# Patient Record
Sex: Female | Born: 1970 | Race: White | Hispanic: No | Marital: Married | Smoking: Current every day smoker
Health system: Southern US, Community
[De-identification: ages and names within clinical notes are randomized; demographics above are authoritative.]

## PROBLEM LIST (undated history)

## (undated) DIAGNOSIS — F419 Anxiety disorder, unspecified: Secondary | ICD-10-CM

## (undated) DIAGNOSIS — T4145XA Adverse effect of unspecified anesthetic, initial encounter: Secondary | ICD-10-CM

## (undated) DIAGNOSIS — I10 Essential (primary) hypertension: Secondary | ICD-10-CM

## (undated) DIAGNOSIS — M1990 Unspecified osteoarthritis, unspecified site: Secondary | ICD-10-CM

## (undated) DIAGNOSIS — T8859XA Other complications of anesthesia, initial encounter: Secondary | ICD-10-CM

## (undated) DIAGNOSIS — I8390 Asymptomatic varicose veins of unspecified lower extremity: Secondary | ICD-10-CM

## (undated) HISTORY — PX: BACK SURGERY: SHX140

## (undated) HISTORY — PX: OTHER SURGICAL HISTORY: SHX169

## (undated) HISTORY — PX: JOINT REPLACEMENT: SHX530

## (undated) HISTORY — PX: ABDOMINAL HYSTERECTOMY: SHX81

---

## 2005-01-12 ENCOUNTER — Emergency Department: Payer: Self-pay | Admitting: Emergency Medicine

## 2008-01-02 ENCOUNTER — Encounter: Admission: RE | Admit: 2008-01-02 | Discharge: 2008-01-02 | Payer: Self-pay | Admitting: Orthopedic Surgery

## 2008-08-27 ENCOUNTER — Ambulatory Visit: Payer: Self-pay | Admitting: *Deleted

## 2008-08-27 ENCOUNTER — Inpatient Hospital Stay (HOSPITAL_COMMUNITY): Admission: RE | Admit: 2008-08-27 | Discharge: 2008-08-28 | Payer: Self-pay | Admitting: Orthopedic Surgery

## 2008-08-28 ENCOUNTER — Encounter (INDEPENDENT_AMBULATORY_CARE_PROVIDER_SITE_OTHER): Payer: Self-pay | Admitting: Orthopedic Surgery

## 2008-12-01 ENCOUNTER — Encounter: Payer: Self-pay | Admitting: Orthopedic Surgery

## 2008-12-12 ENCOUNTER — Encounter: Payer: Self-pay | Admitting: Orthopedic Surgery

## 2009-01-12 ENCOUNTER — Encounter: Payer: Self-pay | Admitting: Orthopedic Surgery

## 2010-01-21 ENCOUNTER — Emergency Department: Payer: Self-pay | Admitting: Emergency Medicine

## 2011-04-26 NOTE — Op Note (Signed)
NAMEKARLISA, GAUBERT NO.:  [id number]   MEDICAL RECORD NO.:  [id number]          PATIENT TYPE:  INP   LOCATION:  5017                         FACILITY:  [facility]   PHYSICIAN:  Balinda Quails, M.D.    DATE OF BIRTH:  1971-08-23   DATE OF PROCEDURE:  08/27/2008  DATE OF DISCHARGE:                               OPERATIVE REPORT   SURGEON:  Balinda Quails, MD.   CO-SURGEONAlvy Beal, MD.   ANESTHETIC:  General endotracheal.   PREOPERATIVE DIAGNOSIS:  L5-S1 degenerative disk disease.   POSTOPERATIVE DIAGNOSIS:  L5-S1 degenerative disk disease.   PROCEDURE:  L5-S1 anterior lumbar interbody fusion (ALIF).   CLINICAL NOTE:  Bonnie Overdorf is a 40 year old female with history of  chronic back pain scheduled at this time to undergo L5-S1 ALIF.  She was  seen preoperatively in holding area.  No contraindications to surgery.  Each of the operative procedure were reviewed with the patient including  potential complications.  She consented for surgery.   OPERATIVE PROCEDURE:  The patient was brought to the operating room in  stable hemodynamic condition.  Placed under general endotracheal  anesthesia.  Pulse oximetry placed on the left foot.  Foley catheter,  arterial line, and central venous catheter were in place.  The abdomen  was prepped and draped in a sterile fashion.   Transverse skin incision was made in the left lower quadrant at the  projection of L5-S1.  Subcutaneous tissue was divided with  electrocautery.  The left anterior rectus sheath was incised from  midline to lateral margin of rectus muscle.  Rectus muscle was mobilized  medially and the left retroperitoneal space was entered laterally.  The  peritoneal contents were rotated anteriorly.  The left ureter was  mobilized and retracted with the peritoneal contents.  The left common  external iliac artery identified and skeletonized bluntly.  The left  common iliac vein was pushed laterally and the  L5-S1 disk could be  palpated.  Blunt dissection was carried directly down under the disk and  small bridging veins were controlled with bipolar cautery and divided.  The middle sacral vessels were ligated with clips and divided.  The L5-  S1 disk was then exposed with blunt dissection from left-to-right.   At completion of this, the Thompson-Brau retractor system was used and  reverse-lip blades were placed on the lateral margins of L5-S1  bilaterally.  Malleable retractors were placed inferiorly and  superiorly.  The fusion procedure was then completed by Dr. Shon Baton.  Once completed, retractors were removed and closure dictated separately  by Dr. Shon Baton.  There were no apparent complications.      Balinda Quails, M.D.  Electronically Signed     PGH/MEDQ  D:  08/27/2008  T:  08/28/2008  Job:  [id number]   cc:   Alvy Beal, MD

## 2011-04-26 NOTE — Op Note (Signed)
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   POSTOPERATIVE DIAGNOSES:  Degenerative lumbar spondylolisthesis and  degenerative disk disease L5-S1.   OPERATIVE PROCEDURE:  Anterior lumbar interbody fusion L5-S1 with a  [employer] PEEK small 14 mm high interbody graft packed with active fuse  with the RSI anterior lumbar zero-profile plating system with 20 mm  locking screws.   COMPLICATIONS:  None.   CONDITION:  Stable.   HISTORY:  This is a very pleasant 40 year old woman with longstanding  debilitating low back pain.  She has tried and failed multiple forms of  conservative management including narcotic medications, physiotherapy,  chiropractic manipulation, and injection therapy.  Because of the  persistence of her pain and the poor quality of life, she elected to  proceed with surgery.  All appropriate risks, benefits, and alternatives  of the surgery were discussed with the patient and consent was obtained.   OPERATIVE NOTE:  The patient was brought to the operating room, placed  supine on the operating table.  After successful induction of general  anesthesia endotracheal intubation, TEDs, SCDs, and Foley were applied.  The anterior lumbar spine was then prepped and draped in a standard  fashion.  Dr. Liliane Bade then came and did a standard anterior lumbar  approach to transverse Pfannenstiel incision.  I was present to assist  him for the approach.  Once the anterior lumbar spine was visualized,  then the iliac vessels were retracted and veins were retracted.  The  Thompson  retractors were placed at the appropriate depth.  We then  marked the L5-S1 space, took an x-ray and confirmed the appropriate  level.   At this point, I performed an annulotomy with a #10 blade scalpel and  then resected the disk with a combination of curettes, Kerrison  rongeurs.  I then distracted the interbody space and continued working  posteriorly down to the posterior annulus.  I then released the  posterior annulus and then was able to pass a 4-mm Kerrison behind the  bodies of L5-S1 resecting the posterior osteophytes and removing and  performing a more generous foraminotomy.   At this point, I had clear parallel distraction of the endplates and I  completely released the annulus and had adequate decompression.   At this point, satisfied with my diskectomy, I then made sure I had  removed all of this off the cartilaginous endplate and I had excellent  bleeding endplate bone for fusion.  I rasped and then I measured with a  14-mm and 16-mm interbody device.  I felt as though the 14 provided less  posterior distraction and it gave me adequate interbody fixation.  I  then took the [employer]  interbody PEEK cage packed with active fuse and  used the self-distracting inserter to place it to the appropriate depth.  Once I was properly positioned, I was then able to pack the remaining  portion of active fuse anterior to the cage for sentinel fusion.  With  all the bone graft properly positioned, I then swept behind the  interbody device with a nerve hook to ensure that it was still on, it  was not irritating, it was not too far posterior and there was no bone  fragments that were pushed back.  I then took the appropriate sized  trial for the fixation device and then took the actual zero-profile  plating system, secured it and malleted at the appropriate depth.  I  then secured it to the body of L5 with two 20-mm lag screws and a single  lag screw into the body of S1.  I then had  excellent purchase with all  the screws and I put the final locking cap to prevent backup.  This was  torqued appropriately.  I then irrigated copiously with normal saline,  removed the retracting blades and checked for any brisk bleeding.  Once  hemostasis was obtained, I then took final AP and lateral views with  fluoro demonstrating satisfactory depth of the graft and position of the  hardware.  I then closed the rectus sheath with a running #1 Vicryl  suture and did a layered closure with a 0 Vicryl and a 2-0 interrupted  Vicryl.  I then used a 3-0 Monocryl to close the skin.  Steri-Strips and  a dry dressing were applied.  She was extubated and transferred to the  PACU without incident.  At the end of the case, all needle and sponge  counts were correct and the final abdominal x-rays were read by the  Radiology and there were noted to be no retained surgical devices other  than the appropriate hardware.      Alvy Beal, MD  Electronically Signed     DDB/MEDQ  D:  08/27/2008  T:  08/28/2008  Job:  [id number]   cc:   Balinda Quails, M.D.

## 2011-04-28 ENCOUNTER — Ambulatory Visit: Payer: Self-pay | Admitting: Family Medicine

## 2011-09-12 LAB — BASIC METABOLIC PANEL
BUN: 3 — ABNORMAL LOW
CO2: 30
Chloride: 103
Potassium: 3.5

## 2011-09-12 LAB — CBC
HCT: 37.6
MCV: 101.3 — ABNORMAL HIGH
Platelets: 272
RBC: 3.72 — ABNORMAL LOW
WBC: 8.5

## 2011-09-14 LAB — BASIC METABOLIC PANEL
BUN: 7
CO2: 27
Chloride: 101
Creatinine, Ser: 0.58

## 2011-09-14 LAB — TYPE AND SCREEN: ABO/RH(D): O POS

## 2011-09-14 LAB — CBC
MCHC: 33.8
MCV: 100.4 — ABNORMAL HIGH
Platelets: 285

## 2011-11-25 ENCOUNTER — Ambulatory Visit: Payer: Self-pay | Admitting: Family Medicine

## 2012-07-13 ENCOUNTER — Other Ambulatory Visit (HOSPITAL_COMMUNITY): Payer: Self-pay | Admitting: Orthopedic Surgery

## 2012-07-13 DIAGNOSIS — M25561 Pain in right knee: Secondary | ICD-10-CM

## 2012-07-23 ENCOUNTER — Other Ambulatory Visit (HOSPITAL_COMMUNITY): Payer: Self-pay

## 2012-07-23 ENCOUNTER — Encounter (HOSPITAL_COMMUNITY): Payer: Self-pay

## 2012-07-24 ENCOUNTER — Ambulatory Visit (HOSPITAL_COMMUNITY)
Admission: RE | Admit: 2012-07-24 | Discharge: 2012-07-24 | Disposition: A | Discharge status: Home / Self Care | Payer: Medicare Other | Source: Ambulatory Visit | Attending: Orthopedic Surgery | Admitting: Orthopedic Surgery

## 2012-07-24 ENCOUNTER — Encounter (HOSPITAL_COMMUNITY)
Admission: RE | Admit: 2012-07-24 | Discharge: 2012-07-24 | Disposition: A | Discharge status: Home / Self Care | Payer: Medicare Other | Source: Ambulatory Visit | Attending: Orthopedic Surgery | Admitting: Orthopedic Surgery

## 2012-07-24 DIAGNOSIS — M25561 Pain in right knee: Secondary | ICD-10-CM

## 2012-07-24 DIAGNOSIS — Z96659 Presence of unspecified artificial knee joint: Secondary | ICD-10-CM | POA: Insufficient documentation

## 2012-07-24 DIAGNOSIS — M25569 Pain in unspecified knee: Secondary | ICD-10-CM | POA: Insufficient documentation

## 2012-07-24 MED ORDER — TECHNETIUM TC 99M MEDRONATE IV KIT
25.0000 | PACK | Freq: Once | INTRAVENOUS | Status: AC | PRN
  Administered 2012-07-24: 25 via INTRAVENOUS

## 2014-01-03 ENCOUNTER — Other Ambulatory Visit (HOSPITAL_COMMUNITY): Payer: Self-pay | Admitting: Orthopedic Surgery

## 2014-01-03 DIAGNOSIS — M25561 Pain in right knee: Secondary | ICD-10-CM

## 2014-01-07 ENCOUNTER — Encounter (HOSPITAL_COMMUNITY)
Admission: RE | Admit: 2014-01-07 | Discharge: 2014-01-07 | Disposition: A | Discharge status: Home / Self Care | Payer: Medicare Other | Source: Ambulatory Visit | Attending: Orthopedic Surgery | Admitting: Orthopedic Surgery

## 2014-01-07 ENCOUNTER — Ambulatory Visit (HOSPITAL_COMMUNITY)
Admission: RE | Admit: 2014-01-07 | Discharge: 2014-01-07 | Disposition: A | Discharge status: Home / Self Care | Payer: Medicare Other | Source: Ambulatory Visit | Attending: Orthopedic Surgery | Admitting: Orthopedic Surgery

## 2014-01-07 DIAGNOSIS — M25569 Pain in unspecified knee: Secondary | ICD-10-CM | POA: Insufficient documentation

## 2014-01-07 DIAGNOSIS — M25561 Pain in right knee: Secondary | ICD-10-CM

## 2014-01-07 DIAGNOSIS — Z96659 Presence of unspecified artificial knee joint: Secondary | ICD-10-CM | POA: Insufficient documentation

## 2014-01-07 MED ORDER — TECHNETIUM TC 99M MEDRONATE IV KIT
25.0000 | PACK | Freq: Once | INTRAVENOUS | Status: AC | PRN
  Administered 2014-01-07: 25 via INTRAVENOUS

## 2014-06-04 ENCOUNTER — Encounter (HOSPITAL_COMMUNITY): Payer: Self-pay | Admitting: Pharmacy Technician

## 2014-06-04 NOTE — Patient Instructions (Signed)
Wendy Henderson  06/04/2014   Your procedure is scheduled on:  06/09/14  0730-1005  [facility]  Follow the Signs to Short Stay Center at      0515    am  Call this number if you have problems the morning of surgery: [phone]   Remember:   Do not eat food or drink liquids after midnight.   Take these medicines the morning of surgery with A SIP OF WATER:    Do not wear jewelry, make-up or nail polish.  Do not wear lotions, powders, or perfumes, deodorant.    Do not shave 48 hours prior to surgery.   Do not bring valuables to the hospital.  Contacts, dentures or bridgework may not be worn into surgery.  Leave suitcase in the car. After surgery it may be brought to your room.  For patients admitted to the hospital, checkout time is 11:00 AM the day of  discharge.   [facility] - Preparing for Surgery Before surgery, you can play an important role.  Because skin is not sterile, your skin needs to be as free of germs as possible.  You can reduce the number of germs on your skin by washing with CHG (chlorahexidine gluconate) soap before surgery.  CHG is an antiseptic cleaner which kills germs and bonds with the skin to continue killing germs even after washing. Please DO NOT use if you have an allergy to CHG or antibacterial soaps.  If your skin becomes reddened/irritated stop using the CHG and inform your nurse when you arrive at Short Stay. Do not shave (including legs and underarms) for at least 48 hours prior to the first CHG shower.  You may shave your face/neck. Please follow these instructions carefully:  1.  Shower with CHG Soap the night before surgery and the  morning of Surgery.  2.  If you choose to wash your hair, wash your hair first as usual with your  normal  shampoo.  3.  After you shampoo, rinse your hair and body thoroughly to remove the  shampoo.                           4.  Use CHG as you would any other liquid soap.  You can apply chg directly  to  the skin and wash                       Gently with a scrungie or clean washcloth.  5.  Apply the CHG Soap to your body ONLY FROM THE NECK DOWN.   Do not use on face/ open                           Wound or open sores. Avoid contact with eyes, ears mouth and genitals (private parts).                       Wash face,  Genitals (private parts) with your normal soap.             6.  Wash thoroughly, paying special attention to the area where your surgery  will be performed.  7.  Thoroughly rinse your body with warm water from the neck down.  8.  DO NOT shower/wash with your normal soap after using and rinsing off  the CHG Soap.  9.  Pat yourself dry with a clean towel.            10.  Wear clean pajamas.            11.  Place clean sheets on your bed the night of your first shower and do not  sleep with pets. Day of Surgery : Do not apply any lotions/deodorants the morning of surgery.  Please wear clean clothes to the hospital/surgery center.  FAILURE TO FOLLOW THESE INSTRUCTIONS MAY RESULT IN THE CANCELLATION OF YOUR SURGERY PATIENT SIGNATURE_________________________________  NURSE SIGNATURE__________________________________  ________________________________________________________________________  WHAT IS A BLOOD TRANSFUSION? Blood Transfusion Information  A transfusion is the replacement of blood or some of its parts. Blood is made up of multiple cells which provide different functions.  Red blood cells carry oxygen and are used for blood loss replacement.  White blood cells fight against infection.  Platelets control bleeding.  Plasma helps clot blood.  Other blood products are available for specialized needs, such as hemophilia or other clotting disorders. BEFORE THE TRANSFUSION  Who gives blood for transfusions?   Healthy volunteers who are fully evaluated to make sure their blood is safe. This is blood bank blood. Transfusion therapy is the safest it has ever  been in the practice of medicine. Before blood is taken from a donor, a complete history is taken to make sure that person has no history of diseases nor engages in risky social behavior (examples are intravenous drug use or sexual activity with multiple partners). The donor's travel history is screened to minimize risk of transmitting infections, such as malaria. The donated blood is tested for signs of infectious diseases, such as HIV and hepatitis. The blood is then tested to be sure it is compatible with you in order to minimize the chance of a transfusion reaction. If you or a relative donates blood, this is often done in anticipation of surgery and is not appropriate for emergency situations. It takes many days to process the donated blood. RISKS AND COMPLICATIONS Although transfusion therapy is very safe and saves many lives, the main dangers of transfusion include:   Getting an infectious disease.  Developing a transfusion reaction. This is an allergic reaction to something in the blood you were given. Every precaution is taken to prevent this. The decision to have a blood transfusion has been considered carefully by your caregiver before blood is given. Blood is not given unless the benefits outweigh the risks. AFTER THE TRANSFUSION  Right after receiving a blood transfusion, you will usually feel much better and more energetic. This is especially true if your red blood cells have gotten low (anemic). The transfusion raises the level of the red blood cells which carry oxygen, and this usually causes an energy increase.  The nurse administering the transfusion will monitor you carefully for complications. HOME CARE INSTRUCTIONS  No special instructions are needed after a transfusion. You may find your energy is better. Speak with your caregiver about any limitations on activity for underlying diseases you may have. SEEK MEDICAL CARE IF:   Your condition is not improving after your  transfusion.  You develop redness or irritation at the intravenous (IV) site. SEEK IMMEDIATE MEDICAL CARE IF:  Any of the following symptoms occur over the next 12 hours:  Shaking chills.  You have a temperature by mouth above 102 F (38.9 C), not controlled by medicine.  Chest, back, or muscle pain.  People around you feel you are not acting correctly or  are confused.  Shortness of breath or difficulty breathing.  Dizziness and fainting.  You get a rash or develop hives.  You have a decrease in urine output.  Your urine turns a dark color or changes to pink, red, or brown. Any of the following symptoms occur over the next 10 days:  You have a temperature by mouth above 102 F (38.9 C), not controlled by medicine.  Shortness of breath.  Weakness after normal activity.  The white part of the eye turns yellow (jaundice).  You have a decrease in the amount of urine or are urinating less often.  Your urine turns a dark color or changes to pink, red, or brown. Document Released: 11/25/2000 Document Revised: 02/20/2012 Document Reviewed: 07/14/2008 ExitCare Patient Information 2014 [place].  _______________________________________________________________________  Incentive Spirometer  An incentive spirometer is a tool that can help keep your lungs clear and active. This tool measures how well you are filling your lungs with each breath. Taking long deep breaths may help reverse or decrease the chance of developing breathing (pulmonary) problems (especially infection) following:  A long period of time when you are unable to move or be active. BEFORE THE PROCEDURE   If the spirometer includes an indicator to show your best effort, your nurse or respiratory therapist will set it to a desired goal.  If possible, sit up straight or lean slightly forward. Try not to slouch.  Hold the incentive spirometer in an upright position. INSTRUCTIONS FOR USE  1. Sit on the  edge of your bed if possible, or sit up as far as you can in bed or on a chair. 2. Hold the incentive spirometer in an upright position. 3. Breathe out normally. 4. Place the mouthpiece in your mouth and seal your lips tightly around it. 5. Breathe in slowly and as deeply as possible, raising the piston or the ball toward the top of the column. 6. Hold your breath for 3-5 seconds or for as long as possible. Allow the piston or ball to fall to the bottom of the column. 7. Remove the mouthpiece from your mouth and breathe out normally. 8. Rest for a few seconds and repeat Steps 1 through 7 at least 10 times every 1-2 hours when you are awake. Take your time and take a few normal breaths between deep breaths. 9. The spirometer may include an indicator to show your best effort. Use the indicator as a goal to work toward during each repetition. 10. After each set of 10 deep breaths, practice coughing to be sure your lungs are clear. If you have an incision (the cut made at the time of surgery), support your incision when coughing by placing a pillow or rolled up towels firmly against it. Once you are able to get out of bed, walk around indoors and cough well. You may stop using the incentive spirometer when instructed by your caregiver.  RISKS AND COMPLICATIONS  Take your time so you do not get dizzy or light-headed.  If you are in pain, you may need to take or ask for pain medication before doing incentive spirometry. It is harder to take a deep breath if you are having pain. AFTER USE  Rest and breathe slowly and easily.  It can be helpful to keep track of a log of your progress. Your caregiver can provide you with a simple table to help with this. If you are using the spirometer at home, follow these instructions: [place] IF:  You are having difficultly using the spirometer.  You have trouble using the spirometer as often as instructed.  Your pain medication is not giving enough  relief while using the spirometer.  You develop fever of 100.5 F (38.1 C) or higher. SEEK IMMEDIATE MEDICAL CARE IF:   You cough up bloody sputum that had not been present before.  You develop fever of 102 F (38.9 C) or greater.  You develop worsening pain at or near the incision site. MAKE SURE YOU:   Understand these instructions.  Will watch your condition.  Will get help right away if you are not doing well or get worse. Document Released: 04/10/2007 Document Revised: 02/20/2012 Document Reviewed: 06/11/2007 ExitCare Patient Information 2014 ExitCare, [place].   ________________________________________________________________________    Please read over the following fact sheets that you were given: MRSA Information, coughing and deep breathing exercises, leg exercises

## 2014-06-05 ENCOUNTER — Ambulatory Visit (HOSPITAL_COMMUNITY)
Admission: RE | Admit: 2014-06-05 | Discharge: 2014-06-05 | Disposition: A | Discharge status: Home / Self Care | Payer: Medicare Other | Source: Ambulatory Visit | Attending: Orthopedic Surgery | Admitting: Orthopedic Surgery

## 2014-06-05 ENCOUNTER — Encounter (HOSPITAL_COMMUNITY): Payer: Self-pay

## 2014-06-05 ENCOUNTER — Encounter (HOSPITAL_COMMUNITY)
Admission: RE | Admit: 2014-06-05 | Discharge: 2014-06-05 | Disposition: A | Discharge status: Home / Self Care | Payer: Medicare Other | Source: Ambulatory Visit | Attending: Orthopedic Surgery | Admitting: Orthopedic Surgery

## 2014-06-05 DIAGNOSIS — Z01812 Encounter for preprocedural laboratory examination: Secondary | ICD-10-CM | POA: Insufficient documentation

## 2014-06-05 DIAGNOSIS — Z01818 Encounter for other preprocedural examination: Secondary | ICD-10-CM | POA: Insufficient documentation

## 2014-06-05 DIAGNOSIS — J984 Other disorders of lung: Secondary | ICD-10-CM | POA: Insufficient documentation

## 2014-06-05 DIAGNOSIS — Z01810 Encounter for preprocedural cardiovascular examination: Secondary | ICD-10-CM | POA: Insufficient documentation

## 2014-06-05 HISTORY — DX: Asymptomatic varicose veins of unspecified lower extremity: I83.90

## 2014-06-05 HISTORY — DX: Unspecified osteoarthritis, unspecified site: M19.90

## 2014-06-05 HISTORY — DX: Other complications of anesthesia, initial encounter: T88.59XA

## 2014-06-05 HISTORY — DX: Essential (primary) hypertension: I10

## 2014-06-05 HISTORY — DX: Anxiety disorder, unspecified: F41.9

## 2014-06-05 HISTORY — DX: Adverse effect of unspecified anesthetic, initial encounter: T41.45XA

## 2014-06-05 LAB — URINALYSIS, ROUTINE W REFLEX MICROSCOPIC
BILIRUBIN URINE: NEGATIVE
Glucose, UA: NEGATIVE mg/dL
Hgb urine dipstick: NEGATIVE
KETONES UR: NEGATIVE mg/dL
Nitrite: NEGATIVE
Protein, ur: NEGATIVE mg/dL
SPECIFIC GRAVITY, URINE: 1.015 (ref 1.005–1.030)
UROBILINOGEN UA: 0.2 mg/dL (ref 0.0–1.0)
pH: 7 (ref 5.0–8.0)

## 2014-06-05 LAB — CBC
HCT: 44.4 % (ref 36.0–46.0)
Hemoglobin: 15 g/dL (ref 12.0–15.0)
MCH: 34.2 pg — ABNORMAL HIGH (ref 26.0–34.0)
MCHC: 33.8 g/dL (ref 30.0–36.0)
MCV: 101.1 fL — ABNORMAL HIGH (ref 78.0–100.0)
PLATELETS: 240 10*3/uL (ref 150–400)
RBC: 4.39 MIL/uL (ref 3.87–5.11)
RDW: 13 % (ref 11.5–15.5)
WBC: 7.6 10*3/uL (ref 4.0–10.5)

## 2014-06-05 LAB — URINE MICROSCOPIC-ADD ON

## 2014-06-05 LAB — BASIC METABOLIC PANEL
BUN: 15 mg/dL (ref 6–23)
CALCIUM: 9.8 mg/dL (ref 8.4–10.5)
CO2: 31 mEq/L (ref 19–32)
Chloride: 98 mEq/L (ref 96–112)
Creatinine, Ser: 0.66 mg/dL (ref 0.50–1.10)
GFR calc non Af Amer: >90 mL/min (ref >90)
Glucose, Bld: 126 mg/dL — ABNORMAL HIGH (ref 70–99)
POTASSIUM: 5.4 meq/L — AB (ref 3.7–5.3)
SODIUM: 138 meq/L (ref 137–147)

## 2014-06-05 LAB — APTT: APTT: 42 s — AB (ref 24–37)

## 2014-06-05 LAB — PROTIME-INR
INR: 1.21 (ref 0.00–1.49)
PROTHROMBIN TIME: 15.3 s — AB (ref 11.6–15.2)

## 2014-06-05 LAB — ABO/RH: ABO/RH(D): O POS

## 2014-06-05 LAB — TYPE AND SCREEN
ABO/RH(D): O POS
ANTIBODY SCREEN: NEGATIVE

## 2014-06-05 LAB — SURGICAL PCR SCREEN
MRSA, PCR: NEGATIVE
Staphylococcus aureus: NEGATIVE

## 2014-06-05 NOTE — Progress Notes (Signed)
Called office and left a message for Toni AmendCourtney and Dr Charlann Boxerlin to note CXR result on patient.  Office stated would give them a message due to they were in room with a patient.

## 2014-06-05 NOTE — Progress Notes (Signed)
Ancef ordered preop .  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

## 2014-06-05 NOTE — Progress Notes (Signed)
Patient reported " slight cold" at preop appointment.  Instructed patient to notify surgeon.  Patient voiced understanding.  Please note CXR results.

## 2014-06-05 NOTE — Progress Notes (Signed)
CXR sent via [facility] and Fax to Dr Charlann Boxerlin.  Abnormal lab results sent via EPIC to Dr Charlann Boxerlin to [facility] and Fax.

## 2014-06-07 NOTE — H&P (Signed)
TOTAL KNEE REVISION ADMISSION H&P  Patient is being admitted for right revision total knee arthroplasty from a UKR.  Subjective:  Chief Complaint:   Failed right unicompartmental knee arthroplasty.  HPI: Wendy Henderson, 43 y.o. female, has a history of pain and functional disability in the right knee(s) due to failed previous arthroplasty and patient has failed non-surgical conservative treatments for greater than 12 weeks to include NSAID's and/or analgesics, use of assistive devices and activity modification. The indications for the revision of the total knee arthroplasty are persistant progressive pain. Onset of symptoms was abrupt starting 2+ years ago with rapidlly worsening course since that time.  Prior procedures on the right knee(s) include unicompartmental arthroplasty.  Patient currently rates pain in the right knee(s) at 10 out of 10 with activity. There is night pain, worsening of pain with activity and weight bearing, pain that interferes with activities of daily living, pain with passive range of motion, crepitus and joint swelling.  Patient has evidence of periarticular osteophytes and joint space narrowing by imaging studies. This condition presents safety issues increasing the risk of falls. This patient has had failure of unicompartmental arthroplasty.  There is no current active infection.  Risks, benefits and expectations were discussed with the patient.  Risks including but not limited to the risk of anesthesia, blood clots, nerve damage, blood vessel damage, failure of the prosthesis, infection and up to and including death.  Patient understand the risks, benefits and expectations and wishes to proceed with surgery.   PCP: Evelene CroonNIEMEYER, MEINDERT, MD  D/C Plans:      Home with HHPT  Post-op Meds:       No Rx given  Tranexamic Acid:      is to be given  Decadron:      is to be given  FYI:     Pain management adjust  ASA post-op    Past Medical History  Diagnosis Date  .  Complication of anesthesia     slow to wake up - once   . Hypertension   . Anxiety   . Arthritis   . Varicose veins     Past Surgical History  Procedure Laterality Date  . Abdominal hysterectomy      partial   . Right knee partial replacemnt     . Joint replacement      left   . Left hip surgery       rod left hip to knee   . Back surgery      L5-S1     No prescriptions prior to admission   Allergies  Allergen Reactions  . Penicillins Anaphylaxis  . Aspirin Hives    History  Substance Use Topics  . Smoking status: Current Every Day Smoker -- 0.50 packs/day for 15 years    Types: Cigarettes  . Smokeless tobacco: Never Used  . Alcohol Use: Yes     Comment: occasional     No family history on file.   Review of Systems  Constitutional: Negative.   HENT: Negative.   Eyes: Negative.   Respiratory: Negative.   Cardiovascular: Negative.   Gastrointestinal: Negative.   Genitourinary: Negative.   Musculoskeletal: Positive for back pain and joint pain.  Skin: Negative.   Neurological: Negative.   Endo/Heme/Allergies: Negative.   Psychiatric/Behavioral: The patient is nervous/anxious.      Objective:  Physical Exam  Constitutional: She is oriented to person, place, and time. She appears well-developed and well-nourished.  HENT:  Head: Normocephalic and atraumatic.  Mouth/Throat: Oropharynx is clear and moist.  Eyes: Pupils are equal, round, and reactive to light.  Neck: Neck supple. No JVD present. No tracheal deviation present. No thyromegaly present.  Cardiovascular: Normal rate, regular rhythm, normal heart sounds and intact distal pulses.   Respiratory: Effort normal and breath sounds normal. No respiratory distress. She has no wheezes.  GI: Soft. There is no tenderness. There is no guarding.  Musculoskeletal:       Right knee: She exhibits decreased range of motion, swelling, laceration (healed) and bony tenderness. She exhibits no ecchymosis, no deformity, no  erythema and normal alignment. Tenderness found. Medial joint line and lateral joint line tenderness noted.  Lymphadenopathy:    She has no cervical adenopathy.  Neurological: She is alert and oriented to person, place, and time.  Skin: Skin is warm and dry.  Psychiatric: She has a normal mood and affect.     Imaging Review Plain radiographs demonstrate previous UKR of the right knee(s). The overall alignment is neutral.  The bone quality appears to be good for age and reported activity level.   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   The patient history, physical examination, clinical judgment of the provider and imaging studies are consistent with failed right UKR of the right knee. Revision total knee arthroplasty is deemed medically necessary. The treatment options including medical management, injection therapy, arthroscopy and revision arthroplasty were discussed at length. The risks and benefits of revision total knee arthroplasty were presented and reviewed. The risks due to aseptic loosening, infection, stiffness, patella tracking problems, thromboembolic complications and other imponderables were discussed. The patient acknowledged the explanation, agreed to proceed with the plan and consent was signed. Patient is being admitted for inpatient treatment for surgery, pain control, PT, OT, prophylactic antibiotics, VTE prophylaxis, progressive ambulation and ADL's and discharge planning.The patient is planning to be discharged home with home health services.      Anastasio AuerbachMatthew S. Babish   PA-C  06/07/2014, 5:35 PM

## 2014-06-09 ENCOUNTER — Encounter (HOSPITAL_COMMUNITY)
Admission: RE | Disposition: A | Discharge status: Home Health | Payer: Self-pay | Source: Ambulatory Visit | Attending: Orthopedic Surgery

## 2014-06-09 ENCOUNTER — Encounter (HOSPITAL_COMMUNITY): Payer: Self-pay | Admitting: *Deleted

## 2014-06-09 ENCOUNTER — Inpatient Hospital Stay (HOSPITAL_COMMUNITY): Payer: Medicare Other | Admitting: Registered Nurse

## 2014-06-09 ENCOUNTER — Encounter (HOSPITAL_COMMUNITY): Payer: Medicare Other | Admitting: Registered Nurse

## 2014-06-09 ENCOUNTER — Inpatient Hospital Stay (HOSPITAL_COMMUNITY)
Admission: RE | Admit: 2014-06-09 | Discharge: 2014-06-10 | DRG: 468 | Disposition: A | Discharge status: Home Health | Payer: Medicare Other | Source: Ambulatory Visit | Attending: Orthopedic Surgery | Admitting: Orthopedic Surgery

## 2014-06-09 DIAGNOSIS — Y831 Surgical operation with implant of artificial internal device as the cause of abnormal reaction of the patient, or of later complication, without mention of misadventure at the time of the procedure: Secondary | ICD-10-CM | POA: Diagnosis present

## 2014-06-09 DIAGNOSIS — I10 Essential (primary) hypertension: Secondary | ICD-10-CM | POA: Diagnosis present

## 2014-06-09 DIAGNOSIS — Z880 Allergy status to penicillin: Secondary | ICD-10-CM

## 2014-06-09 DIAGNOSIS — T84099A Other mechanical complication of unspecified internal joint prosthesis, initial encounter: Principal | ICD-10-CM | POA: Diagnosis present

## 2014-06-09 DIAGNOSIS — M129 Arthropathy, unspecified: Secondary | ICD-10-CM | POA: Diagnosis present

## 2014-06-09 DIAGNOSIS — F17200 Nicotine dependence, unspecified, uncomplicated: Secondary | ICD-10-CM | POA: Diagnosis present

## 2014-06-09 DIAGNOSIS — F411 Generalized anxiety disorder: Secondary | ICD-10-CM | POA: Diagnosis present

## 2014-06-09 DIAGNOSIS — Z886 Allergy status to analgesic agent status: Secondary | ICD-10-CM

## 2014-06-09 DIAGNOSIS — Z96659 Presence of unspecified artificial knee joint: Secondary | ICD-10-CM

## 2014-06-09 DIAGNOSIS — Z96651 Presence of right artificial knee joint: Secondary | ICD-10-CM

## 2014-06-09 HISTORY — PX: TOTAL KNEE REVISION: SHX996

## 2014-06-09 SURGERY — TOTAL KNEE REVISION
Anesthesia: Spinal | Site: Knee | Laterality: Right

## 2014-06-09 MED ORDER — ALPRAZOLAM 1 MG PO TABS
1.0000 mg | ORAL_TABLET | Freq: Three times a day (TID) | ORAL | Status: DC | PRN
  Administered 2014-06-09: 1 mg via ORAL
  Filled 2014-06-09: qty 1

## 2014-06-09 MED ORDER — SODIUM CHLORIDE 0.9 % IJ SOLN
INTRAMUSCULAR | Status: AC
  Filled 2014-06-09: qty 10

## 2014-06-09 MED ORDER — FENTANYL CITRATE 0.05 MG/ML IJ SOLN
INTRAMUSCULAR | Status: AC
  Filled 2014-06-09: qty 2

## 2014-06-09 MED ORDER — ALUM & MAG HYDROXIDE-SIMETH 200-200-20 MG/5ML PO SUSP: 30.0000 mL | ORAL | Status: DC | PRN

## 2014-06-09 MED ORDER — PROPOFOL 10 MG/ML IV BOLUS
INTRAVENOUS | Status: AC
  Filled 2014-06-09: qty 20

## 2014-06-09 MED ORDER — MAGNESIUM CITRATE PO SOLN: 1.0000 | Freq: Once | ORAL | Status: AC | PRN

## 2014-06-09 MED ORDER — FERROUS SULFATE 325 (65 FE) MG PO TABS
325.0000 mg | ORAL_TABLET | Freq: Three times a day (TID) | ORAL | Status: DC
  Administered 2014-06-09 – 2014-06-10 (×2): 325 mg via ORAL
  Filled 2014-06-09 (×6): qty 1

## 2014-06-09 MED ORDER — GLYCOPYRROLATE 0.2 MG/ML IJ SOLN
INTRAMUSCULAR | Status: AC
  Filled 2014-06-09: qty 1

## 2014-06-09 MED ORDER — PROMETHAZINE HCL 25 MG/ML IJ SOLN
6.2500 mg | INTRAMUSCULAR | Status: DC | PRN
Start: 2014-06-09 — End: 2014-06-09

## 2014-06-09 MED ORDER — DIPHENHYDRAMINE HCL 25 MG PO CAPS: 25.0000 mg | ORAL_CAPSULE | Freq: Four times a day (QID) | ORAL | Status: DC | PRN

## 2014-06-09 MED ORDER — BUPIVACAINE IN DEXTROSE 0.75-8.25 % IT SOLN
INTRATHECAL | Status: DC | PRN
  Administered 2014-06-09: 1.5 mL via INTRATHECAL

## 2014-06-09 MED ORDER — MIDAZOLAM HCL 2 MG/2ML IJ SOLN
INTRAMUSCULAR | Status: AC
  Filled 2014-06-09: qty 2

## 2014-06-09 MED ORDER — MIDAZOLAM HCL 5 MG/5ML IJ SOLN
INTRAMUSCULAR | Status: DC | PRN
  Administered 2014-06-09 (×2): 2 mg via INTRAVENOUS

## 2014-06-09 MED ORDER — PHENYLEPHRINE HCL 10 MG/ML IJ SOLN
INTRAMUSCULAR | Status: AC
  Filled 2014-06-09: qty 1

## 2014-06-09 MED ORDER — CLINDAMYCIN PHOSPHATE 600 MG/50ML IV SOLN
600.0000 mg | Freq: Four times a day (QID) | INTRAVENOUS | Status: AC
  Administered 2014-06-09 (×2): 600 mg via INTRAVENOUS
  Filled 2014-06-09 (×2): qty 50

## 2014-06-09 MED ORDER — METHOCARBAMOL 1000 MG/10ML IJ SOLN
500.0000 mg | Freq: Four times a day (QID) | INTRAVENOUS | Status: DC | PRN
  Administered 2014-06-09: 500 mg via INTRAVENOUS
  Filled 2014-06-09: qty 5

## 2014-06-09 MED ORDER — SODIUM CHLORIDE 0.9 % IJ SOLN
INTRAMUSCULAR | Status: DC | PRN
  Administered 2014-06-09: 9 mL via INTRAVENOUS

## 2014-06-09 MED ORDER — BUPIVACAINE-EPINEPHRINE (PF) 0.25% -1:200000 IJ SOLN
INTRAMUSCULAR | Status: AC
  Filled 2014-06-09: qty 30

## 2014-06-09 MED ORDER — OXYCODONE HCL 5 MG PO TABS
5.0000 mg | ORAL_TABLET | ORAL | Status: DC
  Administered 2014-06-09: 15 mg via ORAL
  Administered 2014-06-09: 10 mg via ORAL
  Administered 2014-06-09 – 2014-06-10 (×4): 15 mg via ORAL
  Filled 2014-06-09 (×4): qty 3
  Filled 2014-06-09: qty 2
  Filled 2014-06-09: qty 3

## 2014-06-09 MED ORDER — HYDROMORPHONE HCL PF 1 MG/ML IJ SOLN: 0.2500 mg | INTRAMUSCULAR | Status: DC | PRN

## 2014-06-09 MED ORDER — CLINDAMYCIN PHOSPHATE 900 MG/50ML IV SOLN
900.0000 mg | Freq: Once | INTRAVENOUS | Status: AC
  Administered 2014-06-09: 900 mg via INTRAVENOUS
  Filled 2014-06-09: qty 50

## 2014-06-09 MED ORDER — METOPROLOL SUCCINATE ER 50 MG PO TB24
50.0000 mg | ORAL_TABLET | Freq: Every morning | ORAL | Status: DC
  Administered 2014-06-10: 50 mg via ORAL
  Filled 2014-06-09: qty 1

## 2014-06-09 MED ORDER — VANCOMYCIN HCL 1000 MG IV SOLR
INTRAVENOUS | Status: AC
  Filled 2014-06-09: qty 2000

## 2014-06-09 MED ORDER — MENTHOL 3 MG MT LOZG
1.0000 | LOZENGE | OROMUCOSAL | Status: DC | PRN
  Filled 2014-06-09: qty 9

## 2014-06-09 MED ORDER — ONDANSETRON HCL 4 MG PO TABS: 4.0000 mg | ORAL_TABLET | Freq: Four times a day (QID) | ORAL | Status: DC | PRN

## 2014-06-09 MED ORDER — METOCLOPRAMIDE HCL 5 MG/ML IJ SOLN: 5.0000 mg | Freq: Three times a day (TID) | INTRAMUSCULAR | Status: DC | PRN

## 2014-06-09 MED ORDER — METHOCARBAMOL 500 MG PO TABS
500.0000 mg | ORAL_TABLET | Freq: Four times a day (QID) | ORAL | Status: DC | PRN
  Administered 2014-06-09 – 2014-06-10 (×3): 500 mg via ORAL
  Filled 2014-06-09 (×3): qty 1

## 2014-06-09 MED ORDER — CELECOXIB 200 MG PO CAPS
200.0000 mg | ORAL_CAPSULE | Freq: Two times a day (BID) | ORAL | Status: DC
  Administered 2014-06-09 – 2014-06-10 (×2): 200 mg via ORAL
  Filled 2014-06-09 (×4): qty 1

## 2014-06-09 MED ORDER — HYDROMORPHONE HCL PF 1 MG/ML IJ SOLN
0.5000 mg | INTRAMUSCULAR | Status: DC | PRN
  Administered 2014-06-09 (×2): 2 mg via INTRAVENOUS
  Administered 2014-06-09: 1 mg via INTRAVENOUS
  Filled 2014-06-09: qty 2
  Filled 2014-06-09: qty 1
  Filled 2014-06-09: qty 2

## 2014-06-09 MED ORDER — KETOROLAC TROMETHAMINE 30 MG/ML IJ SOLN
INTRAMUSCULAR | Status: DC | PRN
  Administered 2014-06-09: 30 mg via INTRAVENOUS

## 2014-06-09 MED ORDER — ONDANSETRON HCL 4 MG/2ML IJ SOLN: 4.0000 mg | Freq: Four times a day (QID) | INTRAMUSCULAR | Status: DC | PRN

## 2014-06-09 MED ORDER — ACETAMINOPHEN 10 MG/ML IV SOLN
1000.0000 mg | Freq: Once | INTRAVENOUS | Status: AC
  Administered 2014-06-09: 1000 mg via INTRAVENOUS
  Filled 2014-06-09: qty 100

## 2014-06-09 MED ORDER — SODIUM CHLORIDE 0.9 % IV SOLN
INTRAVENOUS | Status: DC
  Administered 2014-06-09: 13:00:00 via INTRAVENOUS
  Filled 2014-06-09 (×4): qty 1000

## 2014-06-09 MED ORDER — DEXAMETHASONE SODIUM PHOSPHATE 10 MG/ML IJ SOLN
10.0000 mg | Freq: Once | INTRAMUSCULAR | Status: DC
  Filled 2014-06-09: qty 1

## 2014-06-09 MED ORDER — PREGABALIN 100 MG PO CAPS
100.0000 mg | ORAL_CAPSULE | Freq: Every day | ORAL | Status: DC
  Administered 2014-06-09: 100 mg via ORAL
  Filled 2014-06-09: qty 1

## 2014-06-09 MED ORDER — PROPOFOL 10 MG/ML IV BOLUS
INTRAVENOUS | Status: DC | PRN
  Administered 2014-06-09 (×2): 50 mg via INTRAVENOUS

## 2014-06-09 MED ORDER — BUPIVACAINE LIPOSOME 1.3 % IJ SUSP: INTRAMUSCULAR | Status: DC | PRN

## 2014-06-09 MED ORDER — PROPOFOL INFUSION 10 MG/ML OPTIME
INTRAVENOUS | Status: DC | PRN
  Administered 2014-06-09: 100 ug/kg/min via INTRAVENOUS

## 2014-06-09 MED ORDER — LACTATED RINGERS IV SOLN: INTRAVENOUS | Status: DC

## 2014-06-09 MED ORDER — DULOXETINE HCL 60 MG PO CPEP
60.0000 mg | ORAL_CAPSULE | Freq: Every morning | ORAL | Status: DC
  Administered 2014-06-09 – 2014-06-10 (×2): 60 mg via ORAL
  Filled 2014-06-09 (×2): qty 1

## 2014-06-09 MED ORDER — PHENOL 1.4 % MT LIQD
1.0000 | OROMUCOSAL | Status: DC | PRN
  Filled 2014-06-09: qty 177

## 2014-06-09 MED ORDER — DOCUSATE SODIUM 100 MG PO CAPS
100.0000 mg | ORAL_CAPSULE | Freq: Two times a day (BID) | ORAL | Status: DC
  Administered 2014-06-09 – 2014-06-10 (×3): 100 mg via ORAL

## 2014-06-09 MED ORDER — DEXAMETHASONE SODIUM PHOSPHATE 10 MG/ML IJ SOLN
10.0000 mg | Freq: Once | INTRAMUSCULAR | Status: AC
  Administered 2014-06-09: 10 mg via INTRAVENOUS

## 2014-06-09 MED ORDER — FENTANYL CITRATE 0.05 MG/ML IJ SOLN
INTRAMUSCULAR | Status: DC | PRN
  Administered 2014-06-09: 100 ug via INTRAVENOUS

## 2014-06-09 MED ORDER — LACTATED RINGERS IV SOLN
INTRAVENOUS | Status: DC | PRN
  Administered 2014-06-09 (×3): via INTRAVENOUS

## 2014-06-09 MED ORDER — MEPERIDINE HCL 50 MG/ML IJ SOLN
6.2500 mg | INTRAMUSCULAR | Status: DC | PRN
Start: 2014-06-09 — End: 2014-06-09

## 2014-06-09 MED ORDER — SODIUM CHLORIDE 0.9 % IR SOLN
Status: DC | PRN
  Administered 2014-06-09: 1000 mL

## 2014-06-09 MED ORDER — TOBRAMYCIN SULFATE 1.2 G IJ SOLR
INTRAMUSCULAR | Status: AC
  Filled 2014-06-09: qty 2.4

## 2014-06-09 MED ORDER — RIVAROXABAN 10 MG PO TABS
10.0000 mg | ORAL_TABLET | ORAL | Status: DC
  Administered 2014-06-10: 10 mg via ORAL
  Filled 2014-06-09 (×2): qty 1

## 2014-06-09 MED ORDER — LOSARTAN POTASSIUM 50 MG PO TABS
50.0000 mg | ORAL_TABLET | Freq: Every morning | ORAL | Status: DC
  Administered 2014-06-09 – 2014-06-10 (×2): 50 mg via ORAL
  Filled 2014-06-09 (×2): qty 1

## 2014-06-09 MED ORDER — GLYCOPYRROLATE 0.2 MG/ML IJ SOLN
INTRAMUSCULAR | Status: DC | PRN
  Administered 2014-06-09: 0.2 mg via INTRAVENOUS

## 2014-06-09 MED ORDER — BUPIVACAINE-EPINEPHRINE 0.25% -1:200000 IJ SOLN
INTRAMUSCULAR | Status: DC | PRN
  Administered 2014-06-09: 30 mL

## 2014-06-09 MED ORDER — FENTANYL 50 MCG/HR TD PT72: 50.0000 ug | MEDICATED_PATCH | TRANSDERMAL | Status: DC

## 2014-06-09 MED ORDER — POLYETHYLENE GLYCOL 3350 17 G PO PACK
17.0000 g | PACK | Freq: Two times a day (BID) | ORAL | Status: DC
  Administered 2014-06-09 – 2014-06-10 (×3): 17 g via ORAL

## 2014-06-09 MED ORDER — TRANEXAMIC ACID 100 MG/ML IV SOLN
1000.0000 mg | Freq: Once | INTRAVENOUS | Status: AC
  Administered 2014-06-09: 1000 mg via INTRAVENOUS
  Filled 2014-06-09: qty 10

## 2014-06-09 MED ORDER — METOCLOPRAMIDE HCL 10 MG PO TABS: 5.0000 mg | ORAL_TABLET | Freq: Three times a day (TID) | ORAL | Status: DC | PRN

## 2014-06-09 MED ORDER — KETOROLAC TROMETHAMINE 30 MG/ML IJ SOLN
INTRAMUSCULAR | Status: AC
  Filled 2014-06-09: qty 1

## 2014-06-09 MED ORDER — BISACODYL 10 MG RE SUPP: 10.0000 mg | Freq: Every day | RECTAL | Status: DC | PRN

## 2014-06-09 MED ORDER — BUPIVACAINE LIPOSOME 1.3 % IJ SUSP
20.0000 mL | Freq: Once | INTRAMUSCULAR | Status: AC
  Administered 2014-06-09: 20 mL
  Filled 2014-06-09: qty 20

## 2014-06-09 MED ORDER — CHLORHEXIDINE GLUCONATE 4 % EX LIQD: 60.0000 mL | Freq: Once | CUTANEOUS | Status: DC

## 2014-06-09 MED ORDER — ZOLPIDEM TARTRATE 5 MG PO TABS
5.0000 mg | ORAL_TABLET | Freq: Every evening | ORAL | Status: DC | PRN
  Administered 2014-06-09: 5 mg via ORAL
  Filled 2014-06-09: qty 1

## 2014-06-09 MED ORDER — PHENYLEPHRINE HCL 10 MG/ML IJ SOLN
10.0000 mg | INTRAMUSCULAR | Status: DC | PRN
  Administered 2014-06-09: 15 ug/min via INTRAVENOUS

## 2014-06-09 MED ORDER — LORATADINE 10 MG PO TABS
10.0000 mg | ORAL_TABLET | Freq: Every day | ORAL | Status: DC
  Administered 2014-06-09 – 2014-06-10 (×2): 10 mg via ORAL
  Filled 2014-06-09 (×2): qty 1

## 2014-06-09 MED ORDER — ONDANSETRON HCL 4 MG/2ML IJ SOLN
INTRAMUSCULAR | Status: DC | PRN
  Administered 2014-06-09: 4 mg via INTRAVENOUS

## 2014-06-09 MED ORDER — PROPOFOL 10 MG/ML IV BOLUS
INTRAVENOUS | Status: AC
Start: 2014-06-09 — End: 2014-06-09
  Filled 2014-06-09: qty 20

## 2014-06-09 SURGICAL SUPPLY — 71 items
BAG ZIPLOCK 12X15 (MISCELLANEOUS) ×2 IMPLANT
BANDAGE ELASTIC 6 VELCRO ST LF (GAUZE/BANDAGES/DRESSINGS) ×2 IMPLANT
BANDAGE ESMARK 6X9 LF (GAUZE/BANDAGES/DRESSINGS) ×1 IMPLANT
BLADE SAW SGTL 13.0X1.19X90.0M (BLADE) ×2 IMPLANT
BLADE SAW SGTL 81X20 HD (BLADE) ×2 IMPLANT
BNDG ESMARK 6X9 LF (GAUZE/BANDAGES/DRESSINGS) ×2
BONE CEMENT GENTAMICIN (Cement) ×6 IMPLANT
BOWL SMART MIX CTS (DISPOSABLE) IMPLANT
BRUSH FEMORAL CANAL (MISCELLANEOUS) IMPLANT
CAP UPCHARGE REVISION TRAY ×2 IMPLANT
CAPT RP KNEE ×2 IMPLANT
CEMENT BONE GENTAMICIN 40 (Cement) ×3 IMPLANT
CEMENT RESTRICTOR DEPUY SZ 4 (Cement) ×2 IMPLANT
CUFF TOURN SGL QUICK 34 (TOURNIQUET CUFF) ×1
CUFF TRNQT CYL 34X4X40X1 (TOURNIQUET CUFF) ×1 IMPLANT
DERMABOND ADVANCED (GAUZE/BANDAGES/DRESSINGS) ×1
DERMABOND ADVANCED .7 DNX12 (GAUZE/BANDAGES/DRESSINGS) ×1 IMPLANT
DRAPE EXTREMITY T 121X128X90 (DRAPE) ×2 IMPLANT
DRAPE POUCH INSTRU U-SHP 10X18 (DRAPES) ×2 IMPLANT
DRAPE U-SHAPE 47X51 STRL (DRAPES) ×2 IMPLANT
DRSG ADAPTIC 3X8 NADH LF (GAUZE/BANDAGES/DRESSINGS) IMPLANT
DRSG AQUACEL AG ADV 3.5X10 (GAUZE/BANDAGES/DRESSINGS) ×2 IMPLANT
DRSG PAD ABDOMINAL 8X10 ST (GAUZE/BANDAGES/DRESSINGS) IMPLANT
DRSG TEGADERM 4X4.75 (GAUZE/BANDAGES/DRESSINGS) IMPLANT
DURAPREP 26ML APPLICATOR (WOUND CARE) ×4 IMPLANT
ELECT REM PT RETURN 9FT ADLT (ELECTROSURGICAL) ×2
ELECTRODE REM PT RTRN 9FT ADLT (ELECTROSURGICAL) ×1 IMPLANT
FACESHIELD WRAPAROUND (MASK) ×10 IMPLANT
GAUZE SPONGE 2X2 8PLY STRL LF (GAUZE/BANDAGES/DRESSINGS) IMPLANT
GAUZE SPONGE 4X4 12PLY STRL (GAUZE/BANDAGES/DRESSINGS) IMPLANT
GLOVE BIOGEL PI IND STRL 7.5 (GLOVE) ×1 IMPLANT
GLOVE BIOGEL PI IND STRL 8 (GLOVE) ×1 IMPLANT
GLOVE BIOGEL PI INDICATOR 7.5 (GLOVE) ×1
GLOVE BIOGEL PI INDICATOR 8 (GLOVE) ×1
GLOVE ECLIPSE 8.0 STRL XLNG CF (GLOVE) ×2 IMPLANT
GLOVE ORTHO TXT STRL SZ7.5 (GLOVE) ×4 IMPLANT
GOWN SPEC L3 XXLG W/TWL (GOWN DISPOSABLE) ×2 IMPLANT
GOWN STRL REUS W/TWL LRG LVL3 (GOWN DISPOSABLE) ×2 IMPLANT
HANDPIECE INTERPULSE COAX TIP (DISPOSABLE) ×1
IMMOBILIZER KNEE 20 (SOFTGOODS) ×2 IMPLANT
IMMOBILIZER KNEE 20 THIGH 36 (SOFTGOODS) IMPLANT
KIT BASIN OR (CUSTOM PROCEDURE TRAY) ×2 IMPLANT
MANIFOLD NEPTUNE II (INSTRUMENTS) ×2 IMPLANT
NDL SAFETY ECLIPSE 18X1.5 (NEEDLE) ×1 IMPLANT
NEEDLE HYPO 18GX1.5 SHARP (NEEDLE) ×1
NS IRRIG 1000ML POUR BTL (IV SOLUTION) ×2 IMPLANT
PACK TOTAL JOINT (CUSTOM PROCEDURE TRAY) ×2 IMPLANT
PADDING CAST ABS 6INX4YD NS (CAST SUPPLIES) ×1
PADDING CAST ABS COTTON 6X4 NS (CAST SUPPLIES) ×1 IMPLANT
PADDING CAST COTTON 6X4 STRL (CAST SUPPLIES) IMPLANT
POSITIONER SURGICAL ARM (MISCELLANEOUS) ×2 IMPLANT
SET HNDPC FAN SPRY TIP SCT (DISPOSABLE) ×1 IMPLANT
SET PAD KNEE POSITIONER (MISCELLANEOUS) ×2 IMPLANT
SPONGE GAUZE 2X2 STER 10/PKG (GAUZE/BANDAGES/DRESSINGS)
SPONGE LAP 18X18 X RAY DECT (DISPOSABLE) ×2 IMPLANT
STAPLER VISISTAT 35W (STAPLE) IMPLANT
STEM TIBIA PFC 13X30MM (Stem) ×2 IMPLANT
SUCTION FRAZIER 12FR DISP (SUCTIONS) ×2 IMPLANT
SUT MNCRL AB 3-0 PS2 18 (SUTURE) ×2 IMPLANT
SUT VIC AB 1 CT1 36 (SUTURE) ×4 IMPLANT
SUT VIC AB 2-0 CT1 27 (SUTURE) ×3
SUT VIC AB 2-0 CT1 TAPERPNT 27 (SUTURE) ×3 IMPLANT
SUT VLOC 180 0 24IN GS25 (SUTURE) ×2 IMPLANT
SYRINGE 60CC LL (MISCELLANEOUS) ×2 IMPLANT
TOWEL OR 17X26 10 PK STRL BLUE (TOWEL DISPOSABLE) ×2 IMPLANT
TOWER CARTRIDGE SMART MIX (DISPOSABLE) ×2 IMPLANT
TRAY FOLEY CATH 14FRSI W/METER (CATHETERS) ×2 IMPLANT
TRAY SLEEVE CEM ML (Knees) ×2 IMPLANT
WATER STERILE IRR 1500ML POUR (IV SOLUTION) ×2 IMPLANT
WEDGE STEP SZ.5 5MM (Knees) ×4 IMPLANT
WRAP KNEE MAXI GEL POST OP (GAUZE/BANDAGES/DRESSINGS) ×2 IMPLANT

## 2014-06-09 NOTE — Op Note (Signed)
NAMENAKARI, BRACKNELL NO.:  [id number]  MEDICAL RECORD NO.:  [id number]  LOCATION:  [facility]                         FACILITY:  [facility]  PHYSICIAN:  Madlyn Frankel. Charlann Boxer, M.D.  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  PROCEDURE:  Revision right total knee arthroplasty.  COMPONENTS USED:  Size 2.5 posterior stabilized femoral component, size 2.5 MBT revision tray with a 13 x 30 stem, 29 cemented sleeve and 5 mm medial and lateral augments.  I used a 10 mm posterior stabilized insert and a 38 patellar button.  SURGEON:  Madlyn Frankel. Charlann Boxer, MD  ASSISTANT:  Lanney Gins, PA-C.  Note, Mr. Carmon Sails was present for the entirety of the case from preoperative position, perioperative management of upper extremity, general facilitation of the case, primary wound closure.  ANESTHESIA:  Spinal.  SPECIMENS:  None.  COMPLICATIONS:  None.  DRAINS:  None.  TOURNIQUET TIME:  Fifty four minutes at 250 mmHg.  COMPLICATIONS:  None.  INDICATION FOR PROCEDURE:  Ms. Hatcher is a 43 year old female with a history of a partial knee arthroplasty that was performed in the setting of a large proximal tibial system.  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  I have been following Ms. Ludtke for a while and so she presented recently with increasing pain.  A repeat workup revealed significant stress change in the proximal tibia versus recurrence of aneurysmal bone cyst.  Given the amount of pain and dysfunction, was having overall quality life, she at this point was ready to proceed  with surgical intervention. We discussed the risks of infection, DVT, component failure, need for future revision surgery, particularly  based on her age, and which she is already been through.  Consent was obtained for benefit of pain relief.  PROCEDURE IN DETAIL:  The patient was brought to operative theater. Once adequate anesthesia, preoperative antibiotics, clindamycin administered due to penicillin allergy, she was positioned supine with the right thigh tourniquet placed.  Right lower extremity was then prepped and draped in sterile fashion.  The leg was placed in [facility] leg holder.  Time-out was performed identifying the patient, planned procedure, and extremity.  Leg was exsanguinated, tourniquet elevated to 250 mmHg.  Midline incision was used extending her old incision up the medial aspect of her joint, of her knee.  Soft tissue planes created. Median arthrotomy was made encountering clear synovial fluid.  No signs of infection.  Following initial exposure synovectomy, attention was first directed to patella, precut measure was noted to be about 23 mm. I resected down to 14 mm, used a 38 patellar button to restore patellar height.  Lug holes were drilled and a metal Shim placed to protect the patella from retractors saw blades.  At this point, attention was directed to the femur.  The femoral canal was opened  to drill, irrigated to try to prevent fat emboli.  Using intramedullary rod, at 3 degrees of valgus, 10 mm of bone was resected off the distal femur.  As I resected and then medially, I was able to undermine the previously placed femoral component and using osteotome to wedge to remove the femoral component without bone loss.  I then revisited this cut medially, making the cut smooth as the distal cut without significant bone defects noted. At this point, the tibia was subluxated anteriorly, using extramedullary guide, I positioned it for a 10 mm cut off the proximal  lateral tibia. With this, following, once this was pinned in position and set to be 2 degrees of posterior slope to allow for the MBT revision tray.  I used an oscillating saw and then removed cut laterally and then medially. The patient was known to have previous significant amount of cement placed in this proximal tibial defect.  After I had done this, I worked around this with the saw as much as possible.  I was able to use an osteotome and removed the tibial component with the entire cement block attached to it.  There was no signs of purulence.  A curette was used to clean out this cavity of any fibrous tissue present.  There was no signs of infection.  No signs of any recurrent aneurysm, bone, cyst, or other pathology in the proximal tibia.  At this point, I sized the cut surface of the tibia to be a size 2.5, I pinned it, 2.5 tibial tray in place, confirmed that it was parallel to the tibial shaft.  Then I drilled for an MBT revision tray with a 13 x 30 stem.  I then broached with a 29 cemented broach to allow for further cement fixation proximally.  At this point, the trial component was impacted into place using a 29 trial sleeve and a keel punch.  At this point, I had a perpendicular surface to base my rotation of femoral component on and off.  At this point, the femur was prepared with primary components after the distal femoral cut had been made.  I sized the femur to be a size 2.5 to size 2.5 rotation, block was then pinned in position using a C-clamp on the proximal tibial cut.  I then replaced this with a 4 in 1 cutting block making anterior posterior and chamfer cuts.  There was a bit of a deficit on the posterior medial aspect with nothing significant that required augmentation.  Following these cuts, the final box cut was made off the lateral aspect of distal femur.  A trial reduction was now carried out with 2.5 femur, the aforementioned tibial tray.  With this, I  trialed initially with a 12.5 insert and then a 15 insert, find that the knee came to full extension with excellent flexion.  The patella tracked through the trochlea without application of pressure.  Given all these findings, I opened up the final components.  Final components were opened as noted. They were configured on the back table under direct supervision including a size 2.5 MBT revision tray with a 13 x 30 stem, 29 cemented sleeve and 5 mm medial and lateral augments.  Once they were configured, we injected the synovial capsule and periosteal layer with 0.25% Marcaine with epinephrine 30 mL and 20 mL of Exparel with 1 mL of Toradol.  Once the cement was ready, the final components were cemented into place.  I had placed a size  4 cement restrictor into the proximal tibia.  The knee had been irrigated with normal saline solution prior to this.  The final components were cemented into place.  The knee was brought to extension with a 10 mm insert until the cement fully cured. Reexamination of knee found that the size 10 insert allowed for full knee extension and excellent flexion with the patella tracking through the trochlea.  Given these findings, the final 10 mm posterior stabilized insert was selected.  The tibial insert was then placed.  The knee was re-irrigated with normal saline solution.  The extensor mechanism was reapproximated using a combination of #1 Vicryl and 0 V- Loc suture.  The remaining wound was closed with 2-0 Vicryl and running 4-0 Monocryl.  The knee was cleaned, dried, and dressed sterilely using Dermabond, Aquacel dressing.  She was then brought to the recovery room in stable condition tolerating the procedure well.  Findings were reviewed with family.  I would at this point, anticipate based on intraoperative findings, opportunity for an excellent result.     Madlyn FrankelMatthew D. Charlann Boxerlin, M.D.     MDO/MEDQ  D:  06/09/2014  T:  06/09/2014  Job:  [id number]

## 2014-06-09 NOTE — Progress Notes (Signed)
Utilization review completed.  

## 2014-06-09 NOTE — Progress Notes (Signed)
Patient states she finished taking antibiotic for UTI 

## 2014-06-09 NOTE — Anesthesia Postprocedure Evaluation (Signed)
  Anesthesia Post-op Note  Patient: Wendy Henderson  Procedure(s) Performed: Procedure(s) (LRB): RIGHT TOTAL KNEE REVISION  (Right)  Patient Location: PACU  Anesthesia Type: Spinal  Level of Consciousness: awake and alert   Airway and Oxygen Therapy: Patient Spontanous Breathing  Post-op Pain: mild  Post-op Assessment: Post-op Vital signs reviewed, Patient's Cardiovascular Status Stable, Respiratory Function Stable, Patent Airway and No signs of Nausea or vomiting  Last Vitals:  Filed Vitals:   06/09/14 1541  BP: 131/81  Pulse: 56  Temp: 36.7 C  Resp: 18    Post-op Vital Signs: stable   Complications: No apparent anesthesia complications

## 2014-06-09 NOTE — Brief Op Note (Signed)
06/09/2014  9:41 AM  PATIENT:  Verdia KubaKimberly F Skillen  43 y.o. female  PRE-OPERATIVE DIAGNOSIS:  FAILED RIGHT UNIcompartmental KNEE REPLACEMENT   POST-OPERATIVE DIAGNOSIS:  FAILED RIGHT UNIcompartmental KNEE REPLACEMENT    PROCEDURE:  Procedure(s): RIGHT TOTAL KNEE REVISION  (Right)  SURGEON:  Surgeon(s) and Role:    * Shelda PalMatthew D Olin, MD - Primary  PHYSICIAN ASSISTANT: Lanney GinsMatthew Babish, PA-C  ANESTHESIA:   spinal  EBL:  Total I/O In: 2125 [I.V.:2125] Out: -   BLOOD ADMINISTERED:none  DRAINS: none   LOCAL MEDICATIONS USED:  Exparel/marcaine combination  SPECIMEN:  No Specimen  DISPOSITION OF SPECIMEN:  N/A  COUNTS:  YES  TOURNIQUET:   Total Tourniquet Time Documented: Thigh (Right) - 55 minutes Total: Thigh (Right) - 55 minutes   DICTATION: .Other Dictation: Dictation Number [id number]  PLAN OF CARE: Admit to inpatient   PATIENT DISPOSITION:  PACU - hemodynamically stable.   Delay start of Pharmacological VTE agent (>24hrs) due to surgical blood loss or risk of bleeding: no

## 2014-06-09 NOTE — Evaluation (Signed)
Physical Therapy Evaluation Patient Details Name: Wendy Henderson MRN: [id number] DOB: 08/21/1971 Today's Date: 06/09/2014   History of Present Illness  43 yo female s/p R TKA 6/29. Hx of HTN, anxiety, arthritits, back sg.   Clinical Impression  On eval, pt required Min assist for mobility-able to ambulate ~40 feet with walker POD 0.     Follow Up Recommendations Home health PT    Equipment Recommendations  None recommended by PT    Recommendations for Other Services       Precautions / Restrictions Precautions Precautions: Fall Restrictions Weight Bearing Restrictions: No      Mobility  Bed Mobility Overal bed mobility: Needs Assistance Bed Mobility: Supine to Sit;Sit to Supine     Supine to sit: Min assist Sit to supine: Min assist   General bed mobility comments: Assist for R LE  Transfers Overall transfer level: Needs assistance Equipment used: Rolling walker (2 wheeled) Transfers: Sit to/from Stand Sit to Stand: Min assist         General transfer comment: Assist to rise, stabilize, control descent. VCs safety, technique, hand placement  Ambulation/Gait Ambulation/Gait assistance: Min assist Ambulation Distance (Feet): 40 Feet Assistive device: Rolling walker (2 wheeled) Gait Pattern/deviations: Step-to pattern;Decreased stance time - right;Decreased step length - left;Decreased step length - right;Antalgic     General Gait Details: slow gait speed. VCs safety, technique, sequence.   Stairs            Wheelchair Mobility    Modified Rankin (Stroke Patients Only)       Balance                                             Pertinent Vitals/Pain 7/10 R knee with activity. Ice applied end of session    Home Living Family/patient expects to be discharged to:: Private residence Living Arrangements: Children Available Help at Discharge: Family Type of Home: Apartment (tri-plex) Home Access: Stairs to enter Entrance  Stairs-Rails: None Entrance Stairs-Number of Steps: 1  Home Layout: One level Home Equipment: Crutches;Cane - single point;Walker - 2 wheels;Walker - standard      Prior Function Level of Independence: Independent with assistive device(s)         Comments: used cane/RW     Hand Dominance        Extremity/Trunk Assessment   Upper Extremity Assessment: Overall WFL for tasks assessed           Lower Extremity Assessment: RLE deficits/detail RLE Deficits / Details: hip flex 2/4, hip abd/add 2/5, moves ankle well    Cervical / Trunk Assessment: Normal  Communication   Communication: No difficulties  Cognition                            General Comments      Exercises        Assessment/Plan    PT Assessment Patient needs continued PT services  PT Diagnosis Difficulty walking;Abnormality of gait;Acute pain   PT Problem List Decreased strength;Decreased range of motion;Decreased activity tolerance;Decreased balance;Decreased mobility;Pain;Decreased knowledge of use of DME;Decreased knowledge of precautions  PT Treatment Interventions DME instruction;Gait training;Stair training;Functional mobility training;Therapeutic activities;Therapeutic exercise;Patient/family education;Balance training   PT Goals (Current goals can be found in the Care Plan section) Acute Rehab PT Goals Patient Stated Goal: regain independence. less pain.  PT  Goal Formulation: With patient Time For Goal Achievement: 06/16/14 Potential to Achieve Goals: Good    Frequency 7X/week   Barriers to discharge        Co-evaluation               End of Session Equipment Utilized During Treatment: Gait belt Activity Tolerance: Patient limited by pain Patient left: in bed;with call bell/phone within reach;with family/visitor present           Time: 1510-1539 PT Time Calculation (min): 29 min   Charges:   PT Evaluation $Initial PT Evaluation Tier I: 1 Procedure PT  Treatments $Gait Training: 8-22 mins $Therapeutic Activity: 8-22 mins   PT G Codes:          [name], MPT Pager: [phone]

## 2014-06-09 NOTE — Anesthesia Procedure Notes (Signed)
Spinal Patient location during procedure: OR Staffing Anesthesiologist: Jabir Dahlem Performed by: anesthesiologist  Preanesthetic Checklist Completed: patient identified, site marked, surgical consent, pre-op evaluation, timeout performed, IV checked, risks and benefits discussed and monitors and equipment checked Spinal Block Patient position: sitting Prep: Betadine Patient monitoring: heart rate, continuous pulse ox and blood pressure Approach: left paramedian Location: L3-4 Injection technique: single-shot Needle Needle type: Sprotte  Needle gauge: 24 G Needle length: 9 cm Additional Notes Expiration date of kit checked and confirmed. Patient tolerated procedure well, without complications.     

## 2014-06-09 NOTE — Interval H&P Note (Signed)
History and Physical Interval Note:  06/09/2014 6:41 AM  Wendy Henderson  has presented today for surgery, with the diagnosis of FAILED RIGHT UNI KNEE REPLACEMENT   The various methods of treatment have been discussed with the patient and family. After consideration of risks, benefits and other options for treatment, the patient has consented to  Procedure(s): RIGHT TOTAL KNEE REVISION  (Right) as a surgical intervention .  The patient's history has been reviewed, patient examined, no change in status, stable for surgery.  I have reviewed the patient's chart and labs.  Questions were answered to the patient's satisfaction.     Shelda PalLIN,MATTHEW D

## 2014-06-09 NOTE — Transfer of Care (Signed)
Immediate Anesthesia Transfer of Care Note  Patient: Wendy Henderson  Procedure(s) Performed: Procedure(s): RIGHT TOTAL KNEE REVISION  (Right)  Patient Location: PACU  Anesthesia Type:Spinal  Level of Consciousness: awake, alert , oriented and patient cooperative  Airway & Oxygen Therapy: Patient Spontanous Breathing and Patient connected to face mask oxygen  Post-op Assessment: Report given to PACU RN and Post -op Vital signs reviewed and stable  Post vital signs: stable  Complications: No apparent anesthesia complications

## 2014-06-09 NOTE — Anesthesia Preprocedure Evaluation (Addendum)
Anesthesia Evaluation  Patient identified by MRN, date of birth, ID band Patient awake    Reviewed: Allergy & Precautions, H&P , NPO status , Patient's Chart, lab work & pertinent test results  History of Anesthesia Complications (+) PROLONGED EMERGENCE  Airway Mallampati: II TM Distance: >3 FB Neck ROM: Full    Dental no notable dental hx.    Pulmonary Current Smoker,  breath sounds clear to auscultation  Pulmonary exam normal       Cardiovascular hypertension, Pt. on medications Rhythm:Regular Rate:Normal     Neuro/Psych negative neurological ROS  negative psych ROS   GI/Hepatic negative GI ROS, Neg liver ROS,   Endo/Other  negative endocrine ROS  Renal/GU negative Renal ROS  negative genitourinary   Musculoskeletal negative musculoskeletal ROS (+)   Abdominal   Peds negative pediatric ROS (+)  Hematology negative hematology ROS (+)   Anesthesia Other Findings Chronic pain. Fentanyl patch  Reproductive/Obstetrics negative OB ROS                          Anesthesia Physical Anesthesia Plan  ASA: III  Anesthesia Plan: Spinal   Post-op Pain Management:    Induction:   Airway Management Planned: Simple Face Mask  Additional Equipment:   Intra-op Plan:   Post-operative Plan:   Informed Consent: I have reviewed the patients History and Physical, chart, labs and discussed the procedure including the risks, benefits and alternatives for the proposed anesthesia with the patient or authorized representative who has indicated his/her understanding and acceptance.   Dental advisory given  Plan Discussed with: CRNA  Anesthesia Plan Comments:         Anesthesia Quick Evaluation

## 2014-06-10 LAB — BASIC METABOLIC PANEL
BUN: 15 mg/dL (ref 6–23)
CHLORIDE: 99 meq/L (ref 96–112)
CO2: 27 mEq/L (ref 19–32)
Calcium: 8.5 mg/dL (ref 8.4–10.5)
Creatinine, Ser: 0.65 mg/dL (ref 0.50–1.10)
GFR calc non Af Amer: >90 mL/min (ref >90)
GLUCOSE: 137 mg/dL — AB (ref 70–99)
Potassium: 4.2 mEq/L (ref 3.7–5.3)
Sodium: 137 mEq/L (ref 137–147)

## 2014-06-10 LAB — CBC
HEMATOCRIT: 37.1 % (ref 36.0–46.0)
HEMOGLOBIN: 12.5 g/dL (ref 12.0–15.0)
MCH: 33.5 pg (ref 26.0–34.0)
MCHC: 33.7 g/dL (ref 30.0–36.0)
MCV: 99.5 fL (ref 78.0–100.0)
Platelets: 182 10*3/uL (ref 150–400)
RBC: 3.73 MIL/uL — ABNORMAL LOW (ref 3.87–5.11)
RDW: 12.6 % (ref 11.5–15.5)
WBC: 12.2 10*3/uL — AB (ref 4.0–10.5)

## 2014-06-10 MED ORDER — ACETAMINOPHEN 325 MG PO TABS: 325.0000 mg | ORAL_TABLET | Freq: Four times a day (QID) | ORAL | Status: AC | PRN

## 2014-06-10 MED ORDER — RIVAROXABAN 10 MG PO TABS: 10.0000 mg | ORAL_TABLET | ORAL | Status: AC

## 2014-06-10 MED ORDER — DSS 100 MG PO CAPS: 100.0000 mg | ORAL_CAPSULE | Freq: Two times a day (BID) | ORAL | Status: AC

## 2014-06-10 MED ORDER — FERROUS SULFATE 325 (65 FE) MG PO TABS: 325.0000 mg | ORAL_TABLET | Freq: Three times a day (TID) | ORAL | Status: AC

## 2014-06-10 MED ORDER — OXYCODONE HCL 5 MG PO TABS: 5.0000 mg | ORAL_TABLET | ORAL | Status: AC | PRN

## 2014-06-10 MED ORDER — OXYCODONE HCL 5 MG PO TABS: 5.0000 mg | ORAL_TABLET | ORAL | Status: DC | PRN

## 2014-06-10 MED ORDER — POLYETHYLENE GLYCOL 3350 17 G PO PACK: 17.0000 g | PACK | Freq: Two times a day (BID) | ORAL | Status: AC

## 2014-06-10 MED ORDER — TIZANIDINE HCL 4 MG PO TABS: 4.0000 mg | ORAL_TABLET | Freq: Two times a day (BID) | ORAL | Status: AC

## 2014-06-10 NOTE — Progress Notes (Signed)
Pt to d/c home with [facility]. DME delivered to room at d/c. AVS reviewed and "My Chart" discussed with pt. Pt capable of verbalizing medications, dressing changes, signs and symptoms of infection, and follow-up appointments. Remains hemodynamically stable. No signs and symptoms of distress. Educated pt to return to ER in the case of SOB, dizziness, or chest pain.

## 2014-06-10 NOTE — Discharge Summary (Signed)
Physician Discharge Summary  Patient ID: Wendy KubaKimberly F Nordgren MRN: [id number] DOB/AGE: 45/01/1971 45 y.o.  Admit date: 06/09/2014 Discharge date: 06/10/2014   Procedures:  Procedure(s) (LRB): RIGHT TOTAL KNEE REVISION  (Right)  Attending Physician:  Dr. Durene RomansMatthew Olin   Admission Diagnoses:   Failed right unicompartmental knee arthroplasty  Discharge Diagnoses:  Principal Problem:   S/P right TKA  Past Medical History  Diagnosis Date  . Complication of anesthesia     slow to wake up - once   . Hypertension   . Anxiety   . Arthritis   . Varicose veins     HPI: Wendy Henderson, 43 y.o. female, has a history of pain and functional disability in the right knee(s) due to failed previous arthroplasty and patient has failed non-surgical conservative treatments for greater than 12 weeks to include NSAID's and/or analgesics, use of assistive devices and activity modification. The indications for the revision of the total knee arthroplasty are persistant progressive pain. Onset of symptoms was abrupt starting 2+ years ago with rapidlly worsening course since that time. Prior procedures on the right knee(s) include unicompartmental arthroplasty. Patient currently rates pain in the right knee(s) at 10 out of 10 with activity. There is night pain, worsening of pain with activity and weight bearing, pain that interferes with activities of daily living, pain with passive range of motion, crepitus and joint swelling. Patient has evidence of periarticular osteophytes and joint space narrowing by imaging studies. This condition presents safety issues increasing the risk of falls. This patient has had failure of unicompartmental arthroplasty. There is no current active infection. Risks, benefits and expectations were discussed with the patient. Risks including but not limited to the risk of anesthesia, blood clots, nerve damage, blood vessel damage, failure of the prosthesis, infection and up to and including  death. Patient understand the risks, benefits and expectations and wishes to proceed with surgery.  PCP: Evelene CroonNIEMEYER, MEINDERT, MD   Discharged Condition: good  Hospital Course:  Patient underwent the above stated procedure on 06/09/2014. Patient tolerated the procedure well and brought to the recovery room in good condition and subsequently to the floor.  POD #1 BP: 152/94 ; Pulse: 61 ; Temp: 98.8 F (37.1 C) ; Resp: 18 Patient reports pain as moderate. Doing better than she expected, no events. Ready to do therapy and head home. Reviewed surgery performed, findings as well as post operative course and expectations. Neurovascular intact, dorsiflexion/plantar flexion intact, incision: dressing C/D/I, no cellulitis present and compartment soft.   LABS  Basename    HGB  12.5  HCT  37.1    Discharge Exam: General appearance: alert, cooperative and no distress Extremities: Homans sign is negative, no sign of DVT, no edema, redness or tenderness in the calves or thighs and no ulcers, gangrene or trophic changes  Disposition: Home with follow up in 2 weeks   Follow-up Information   Follow up with Shelda PalLIN,MATTHEW D, MD. Schedule an appointment as soon as possible for a visit in 2 weeks.   Specialty:  Orthopedic Surgery   Contact information:   [address] [place] [address] [phone]       Discharge Instructions   Call MD / Call 911    Complete by:  As directed   If you experience chest pain or shortness of breath, CALL 911 and be transported to the hospital emergency room.  If you develope a fever above 101 F, pus (white drainage) or increased drainage or redness at  the wound, or calf pain, call your surgeon's office.     Change dressing    Complete by:  As directed   Maintain surgical dressing for 10-14 days, or until follow up in the clinic.     Constipation Prevention    Complete by:  As directed   Drink plenty of fluids.  Prune juice may be helpful.  You  may use a stool softener, such as Colace (over the counter) 100 mg twice a day.  Use MiraLax (over the counter) for constipation as needed.     Diet - low sodium heart healthy    Complete by:  As directed      Discharge instructions    Complete by:  As directed   Maintain surgical dressing for 10-14 days, or until follow up in the clinic. [facility]. Call with any questions or concerns.     Driving restrictions    Complete by:  As directed   No driving for 4 weeks     Increase activity slowly as tolerated    Complete by:  As directed      TED hose    Complete by:  As directed   Use stockings (TED hose) for 2 weeks on both leg(s).  You may remove them at night for sleeping.     Weight bearing as tolerated    Complete by:  As directed   Laterality:  right  Extremity:  Lower             Medication List    STOP taking these medications       ibuprofen 200 MG tablet  Commonly known as:  ADVIL,MOTRIN      TAKE these medications       acetaminophen 325 MG tablet  Commonly known as:  TYLENOL  Take 1-2 tablets (325-650 mg total) by mouth every 6 (six) hours as needed.     ALPRAZolam 1 MG tablet  Commonly known as:  XANAX  Take 1 mg by mouth 3 (three) times daily as needed for anxiety.     cetirizine 10 MG tablet  Commonly known as:  ZYRTEC  Take 10 mg by mouth daily.     DSS 100 MG Caps  Take 100 mg by mouth 2 (two) times daily.     DULoxetine 60 MG capsule  Commonly known as:  CYMBALTA  Take 60 mg by mouth every morning.     Eszopiclone 3 MG Tabs  Take 3 mg by mouth at bedtime. Take immediately before bedtime     fentaNYL 50 MCG/HR  Commonly known as:  DURAGESIC - dosed mcg/hr  Place 50 mcg onto the skin every other day.     ferrous sulfate 325 (65 FE) MG tablet  Take 1 tablet (325 mg total) by mouth 3 (three) times daily after meals.     losartan 50 MG tablet  Commonly known as:  COZAAR  Take 50 mg by mouth every morning.       metoprolol succinate 50 MG 24 hr tablet  Commonly known as:  TOPROL-XL  Take 50 mg by mouth every morning. Take with or immediately following a meal.     oxyCODONE 5 MG immediate release tablet  Commonly known as:  Oxy IR/ROXICODONE  Take 1-3 tablets (5-15 mg total) by mouth every 4 (four) hours as needed for severe pain.     polyethylene glycol packet  Commonly known as:  MIRALAX / GLYCOLAX  Take 17  g by mouth 2 (two) times daily.     pregabalin 100 MG capsule  Commonly known as:  LYRICA  Take 100 mg by mouth at bedtime.     rivaroxaban 10 MG Tabs tablet  Commonly known as:  XARELTO  Take 1 tablet (10 mg total) by mouth daily.     tiZANidine 4 MG tablet  Commonly known as:  ZANAFLEX  Take 1 tablet (4 mg total) by mouth 2 (two) times daily.     VITAMIN B 12 PO  Take 1 tablet by mouth daily.         Signed: Anastasio Auerbach. Babish   PA-C  06/10/2014, 12:55 PM

## 2014-06-10 NOTE — Progress Notes (Signed)
Physical Therapy Treatment Patient Details Name: Wendy Henderson MRN: [name] DOB: 04/29/1971 Today's Date: 06/10/2014    History of Present Illness 43 yo female s/p R TKA 6/29. Hx of HTN, anxiety, arthritits, back sg.     PT Comments    Progressing with mobility. Pt requesting to d/c home on today. Practiced ambulation, step negotiation, and exercises. All education completed. No further questions from pt/family. Ready to d/c home from PT standpoint.   Follow Up Recommendations  Home health PT     Equipment Recommendations  None recommended by PT    Recommendations for Other Services       Precautions / Restrictions Precautions Precautions: Fall;Knee Restrictions Weight Bearing Restrictions: No    Mobility  Bed Mobility Overal bed mobility: Needs Assistance Bed Mobility: Sit to Supine       Sit to supine: Min assist   General bed mobility comments: Assist for R LE  Transfers   Equipment used: Rolling walker (2 wheeled) Transfers: Sit to/from Stand Sit to Stand: Min guard         General transfer comment: VCs safety, hand placement. close guard for safety  Ambulation/Gait Ambulation/Gait assistance: Min guard Ambulation Distance (Feet): 70 Feet Assistive device: Rolling walker (2 wheeled) Gait Pattern/deviations: Antalgic;Step-to pattern;Trunk flexed;Decreased stride length;Decreased stance time - right;Decreased weight shift to right     General Gait Details: slow gait speed. VCs safety, technique, sequence. close guard for safety   Stairs Stairs: Yes   Stair Management: Step to pattern;Backwards;With walker Number of Stairs: 1 General stair comments: Pt unable to peform forwards so practiced going up backwards instead. VCs safety, technique, sequence.   Wheelchair Mobility    Modified Rankin (Stroke Patients Only)       Balance                                    Cognition Arousal/Alertness: Awake/alert Behavior During  Therapy: WFL for tasks assessed/performed Overall Cognitive Status: Within Functional Limits for tasks assessed                      Exercises Total Joint Exercises Ankle Circles/Pumps: AROM;Both;10 reps;Supine Quad Sets: AROM;Both;10 reps;Supine Heel Slides: AAROM;Right;10 reps;Supine Hip ABduction/ADduction: AAROM;Right;10 reps;Supine Straight Leg Raises: AAROM;Right;10 reps;Supine Goniometric ROM: 10-45 degrees     General Comments        Pertinent Vitals/Pain 7/10 R knee. Ice applied end of session    Home Living                      Prior Function            PT Goals (current goals can now be found in the care plan section) Progress towards PT goals: Progressing toward goals    Frequency  7X/week    PT Plan Current plan remains appropriate    Co-evaluation             End of Session   Activity Tolerance: Patient limited by pain Patient left: in bed;with call bell/phone within reach;with family/visitor present     Time: 1006-1029 PT Time Calculation (min): 23 min  Charges:  $Gait Training: 8-22 mins $Therapeutic Exercise: 8-22 mins                    G Codes:      Wendy Henderson, MPT Pager: [phone]

## 2014-06-10 NOTE — Progress Notes (Signed)
[facility]   [facility] is providing the following services: RW and Commode  If patient discharges after hours, please call [phone].   Renard HamperLecretia Williamson 06/10/2014, 8:34 AM

## 2014-06-10 NOTE — Progress Notes (Signed)
OT Cancellation Note  Patient Details Name: Wendy KubaKimberly F Lyne MRN: [id number] DOB: 10/08/1971   Cancelled Treatment:    Reason Eval/Treat Not Completed: OT screened, no needs identified, will sign off.  Pt has had multiple ortho surgeries.  3:1 was delivered to room and she will have HH aide.    Rayn Enderson 06/10/2014, 11:18 AM Marica OtterMaryellen Ozie Lupe, OTR/L [phone] 06/10/2014

## 2014-06-10 NOTE — Progress Notes (Signed)
Patient ID: Wendy Henderson, female   DOB: 07/30/1971, 43 y.o.   MRN: [id number] Subjective: 1 Day Post-Op Procedure(s) (LRB): RIGHT TOTAL KNEE REVISION  (Right)    Patient reports pain as moderate.  Doing better than she expected, no events.  Ready to do therapy and head home.  Reviewed surgery performed, findings as well as post operative course and expectations  Objective:   VITALS:   Filed Vitals:   06/10/14 0755  BP:   Pulse:   Temp:   Resp: 18    Neurovascular intact Incision: dressing C/D/I  LABS  Recent Labs  06/10/14 0524  HGB 12.5  HCT 37.1  WBC 12.2*  PLT 182     Recent Labs  06/10/14 0524  NA 137  K 4.2  BUN 15  CREATININE 0.65  GLUCOSE 137*    No results found for this basename: LABPT, INR,  in the last 72 hours   Assessment/Plan: 1 Day Post-Op Procedure(s) (LRB): RIGHT TOTAL KNEE REVISION  (Right)   Advance diet Up with therapy Discharge home with home health today after therapy  Maintain dressing for 2 weeks or until follow up [facility] for first 2 weeks

## 2014-06-17 ENCOUNTER — Other Ambulatory Visit (HOSPITAL_COMMUNITY): Payer: Self-pay | Admitting: Orthopedic Surgery

## 2014-06-17 ENCOUNTER — Ambulatory Visit (HOSPITAL_COMMUNITY)
Admission: RE | Admit: 2014-06-17 | Discharge: 2014-06-17 | Disposition: A | Discharge status: Home / Self Care | Payer: Medicare Other | Source: Ambulatory Visit | Attending: Internal Medicine | Admitting: Internal Medicine

## 2014-06-17 DIAGNOSIS — Z96659 Presence of unspecified artificial knee joint: Secondary | ICD-10-CM | POA: Insufficient documentation

## 2014-06-17 DIAGNOSIS — M79609 Pain in unspecified limb: Secondary | ICD-10-CM

## 2014-06-17 DIAGNOSIS — M7989 Other specified soft tissue disorders: Secondary | ICD-10-CM

## 2014-06-17 NOTE — Progress Notes (Signed)
Right Lower Ext. Venous Duplex Completed. Negative for DVT and SVT. Rita Sturdivant, BS, RDMS, RVT  

## 2014-06-24 ENCOUNTER — Telehealth (HOSPITAL_COMMUNITY): Payer: Self-pay | Admitting: *Deleted

## 2014-09-08 ENCOUNTER — Other Ambulatory Visit: Payer: Self-pay | Admitting: Orthopedic Surgery

## 2014-09-08 DIAGNOSIS — R0989 Other specified symptoms and signs involving the circulatory and respiratory systems: Secondary | ICD-10-CM

## 2014-10-24 ENCOUNTER — Ambulatory Visit
Admission: RE | Admit: 2014-10-24 | Discharge: 2014-10-24 | Disposition: A | Discharge status: Home / Self Care | Payer: Medicare Other | Source: Ambulatory Visit | Attending: Sports Medicine | Admitting: Sports Medicine

## 2014-10-24 ENCOUNTER — Other Ambulatory Visit: Payer: Self-pay | Admitting: Sports Medicine

## 2014-10-24 DIAGNOSIS — M25532 Pain in left wrist: Secondary | ICD-10-CM

## 2014-10-27 ENCOUNTER — Other Ambulatory Visit: Payer: Medicare Other

## 2015-01-06 DIAGNOSIS — M62830 Muscle spasm of back: Secondary | ICD-10-CM | POA: Diagnosis not present

## 2015-01-06 DIAGNOSIS — M47817 Spondylosis without myelopathy or radiculopathy, lumbosacral region: Secondary | ICD-10-CM | POA: Diagnosis not present

## 2015-01-06 DIAGNOSIS — G894 Chronic pain syndrome: Secondary | ICD-10-CM | POA: Diagnosis not present

## 2015-01-06 DIAGNOSIS — F419 Anxiety disorder, unspecified: Secondary | ICD-10-CM | POA: Diagnosis not present

## 2015-02-04 DIAGNOSIS — F419 Anxiety disorder, unspecified: Secondary | ICD-10-CM | POA: Diagnosis not present

## 2015-02-04 DIAGNOSIS — M47817 Spondylosis without myelopathy or radiculopathy, lumbosacral region: Secondary | ICD-10-CM | POA: Diagnosis not present

## 2015-02-04 DIAGNOSIS — G894 Chronic pain syndrome: Secondary | ICD-10-CM | POA: Diagnosis not present

## 2015-02-04 DIAGNOSIS — M62830 Muscle spasm of back: Secondary | ICD-10-CM | POA: Diagnosis not present

## 2015-02-12 DIAGNOSIS — F17210 Nicotine dependence, cigarettes, uncomplicated: Secondary | ICD-10-CM | POA: Diagnosis not present

## 2015-02-12 DIAGNOSIS — F419 Anxiety disorder, unspecified: Secondary | ICD-10-CM | POA: Diagnosis not present

## 2015-02-12 DIAGNOSIS — I10 Essential (primary) hypertension: Secondary | ICD-10-CM | POA: Diagnosis not present

## 2015-02-12 DIAGNOSIS — F329 Major depressive disorder, single episode, unspecified: Secondary | ICD-10-CM | POA: Diagnosis not present

## 2015-02-12 DIAGNOSIS — J449 Chronic obstructive pulmonary disease, unspecified: Secondary | ICD-10-CM | POA: Diagnosis not present

## 2015-02-23 DIAGNOSIS — M47817 Spondylosis without myelopathy or radiculopathy, lumbosacral region: Secondary | ICD-10-CM | POA: Diagnosis not present

## 2015-03-10 DIAGNOSIS — M47817 Spondylosis without myelopathy or radiculopathy, lumbosacral region: Secondary | ICD-10-CM | POA: Diagnosis not present

## 2015-03-31 DIAGNOSIS — F419 Anxiety disorder, unspecified: Secondary | ICD-10-CM | POA: Diagnosis not present

## 2015-03-31 DIAGNOSIS — M47817 Spondylosis without myelopathy or radiculopathy, lumbosacral region: Secondary | ICD-10-CM | POA: Diagnosis not present

## 2015-03-31 DIAGNOSIS — G894 Chronic pain syndrome: Secondary | ICD-10-CM | POA: Diagnosis not present

## 2015-03-31 DIAGNOSIS — M62830 Muscle spasm of back: Secondary | ICD-10-CM | POA: Diagnosis not present

## 2015-04-28 DIAGNOSIS — M47817 Spondylosis without myelopathy or radiculopathy, lumbosacral region: Secondary | ICD-10-CM | POA: Diagnosis not present

## 2015-04-28 DIAGNOSIS — F419 Anxiety disorder, unspecified: Secondary | ICD-10-CM | POA: Diagnosis not present

## 2015-04-28 DIAGNOSIS — G894 Chronic pain syndrome: Secondary | ICD-10-CM | POA: Diagnosis not present

## 2015-04-28 DIAGNOSIS — M62830 Muscle spasm of back: Secondary | ICD-10-CM | POA: Diagnosis not present

## 2015-05-26 DIAGNOSIS — G894 Chronic pain syndrome: Secondary | ICD-10-CM | POA: Diagnosis not present

## 2015-05-26 DIAGNOSIS — F419 Anxiety disorder, unspecified: Secondary | ICD-10-CM | POA: Diagnosis not present

## 2015-05-26 DIAGNOSIS — M47817 Spondylosis without myelopathy or radiculopathy, lumbosacral region: Secondary | ICD-10-CM | POA: Diagnosis not present

## 2015-05-26 DIAGNOSIS — M62830 Muscle spasm of back: Secondary | ICD-10-CM | POA: Diagnosis not present

## 2015-06-02 DIAGNOSIS — J449 Chronic obstructive pulmonary disease, unspecified: Secondary | ICD-10-CM | POA: Diagnosis not present

## 2015-06-02 DIAGNOSIS — Z79899 Other long term (current) drug therapy: Secondary | ICD-10-CM | POA: Diagnosis not present

## 2015-06-02 DIAGNOSIS — Z716 Tobacco abuse counseling: Secondary | ICD-10-CM | POA: Diagnosis not present

## 2015-06-02 DIAGNOSIS — F17200 Nicotine dependence, unspecified, uncomplicated: Secondary | ICD-10-CM | POA: Diagnosis not present

## 2015-06-02 DIAGNOSIS — M62838 Other muscle spasm: Secondary | ICD-10-CM | POA: Diagnosis not present

## 2015-06-02 DIAGNOSIS — F419 Anxiety disorder, unspecified: Secondary | ICD-10-CM | POA: Diagnosis not present

## 2015-06-16 DIAGNOSIS — M47816 Spondylosis without myelopathy or radiculopathy, lumbar region: Secondary | ICD-10-CM | POA: Diagnosis not present

## 2015-06-22 DIAGNOSIS — M62830 Muscle spasm of back: Secondary | ICD-10-CM | POA: Diagnosis not present

## 2015-06-22 DIAGNOSIS — M47817 Spondylosis without myelopathy or radiculopathy, lumbosacral region: Secondary | ICD-10-CM | POA: Diagnosis not present

## 2015-06-22 DIAGNOSIS — G894 Chronic pain syndrome: Secondary | ICD-10-CM | POA: Diagnosis not present

## 2015-06-22 DIAGNOSIS — F419 Anxiety disorder, unspecified: Secondary | ICD-10-CM | POA: Diagnosis not present

## 2015-07-27 DIAGNOSIS — M62830 Muscle spasm of back: Secondary | ICD-10-CM | POA: Diagnosis not present

## 2015-07-27 DIAGNOSIS — M47817 Spondylosis without myelopathy or radiculopathy, lumbosacral region: Secondary | ICD-10-CM | POA: Diagnosis not present

## 2015-07-27 DIAGNOSIS — G894 Chronic pain syndrome: Secondary | ICD-10-CM | POA: Diagnosis not present

## 2015-07-27 DIAGNOSIS — F419 Anxiety disorder, unspecified: Secondary | ICD-10-CM | POA: Diagnosis not present

## 2015-08-25 DIAGNOSIS — G894 Chronic pain syndrome: Secondary | ICD-10-CM | POA: Diagnosis not present

## 2015-08-25 DIAGNOSIS — Z79891 Long term (current) use of opiate analgesic: Secondary | ICD-10-CM | POA: Diagnosis not present

## 2015-08-25 DIAGNOSIS — M47817 Spondylosis without myelopathy or radiculopathy, lumbosacral region: Secondary | ICD-10-CM | POA: Diagnosis not present

## 2015-08-25 DIAGNOSIS — M62830 Muscle spasm of back: Secondary | ICD-10-CM | POA: Diagnosis not present

## 2015-08-25 DIAGNOSIS — F419 Anxiety disorder, unspecified: Secondary | ICD-10-CM | POA: Diagnosis not present

## 2015-09-16 DIAGNOSIS — M545 Low back pain: Secondary | ICD-10-CM | POA: Diagnosis not present

## 2015-09-16 DIAGNOSIS — J302 Other seasonal allergic rhinitis: Secondary | ICD-10-CM | POA: Diagnosis not present

## 2015-09-16 DIAGNOSIS — G894 Chronic pain syndrome: Secondary | ICD-10-CM | POA: Diagnosis not present

## 2015-09-16 DIAGNOSIS — F419 Anxiety disorder, unspecified: Secondary | ICD-10-CM | POA: Diagnosis not present

## 2015-09-30 DIAGNOSIS — R7989 Other specified abnormal findings of blood chemistry: Secondary | ICD-10-CM | POA: Diagnosis not present

## 2015-09-30 DIAGNOSIS — E559 Vitamin D deficiency, unspecified: Secondary | ICD-10-CM | POA: Diagnosis not present

## 2015-09-30 DIAGNOSIS — E784 Other hyperlipidemia: Secondary | ICD-10-CM | POA: Diagnosis not present

## 2015-09-30 DIAGNOSIS — R5383 Other fatigue: Secondary | ICD-10-CM | POA: Diagnosis not present

## 2016-05-17 ENCOUNTER — Emergency Department
Admission: EM | Admit: 2016-05-17 | Discharge: 2016-05-17 | Disposition: A | Discharge status: Home / Self Care | Payer: Medicare Other | Attending: Emergency Medicine | Admitting: Emergency Medicine

## 2016-05-17 DIAGNOSIS — I83892 Varicose veins of left lower extremities with other complications: Secondary | ICD-10-CM | POA: Diagnosis present

## 2016-05-17 DIAGNOSIS — M1990 Unspecified osteoarthritis, unspecified site: Secondary | ICD-10-CM | POA: Diagnosis not present

## 2016-05-17 DIAGNOSIS — R58 Hemorrhage, not elsewhere classified: Secondary | ICD-10-CM | POA: Diagnosis not present

## 2016-05-17 DIAGNOSIS — Z79899 Other long term (current) drug therapy: Secondary | ICD-10-CM | POA: Insufficient documentation

## 2016-05-17 DIAGNOSIS — F17210 Nicotine dependence, cigarettes, uncomplicated: Secondary | ICD-10-CM | POA: Diagnosis not present

## 2016-05-17 DIAGNOSIS — I10 Essential (primary) hypertension: Secondary | ICD-10-CM | POA: Diagnosis not present

## 2016-05-17 DIAGNOSIS — Z96651 Presence of right artificial knee joint: Secondary | ICD-10-CM | POA: Insufficient documentation

## 2016-05-17 MED ORDER — LIDOCAINE-EPINEPHRINE (PF) 1 %-1:200000 IJ SOLN
INTRAMUSCULAR | Status: AC
  Filled 2016-05-17: qty 30

## 2016-05-17 NOTE — ED Notes (Signed)
Pt in with bleeding varicose vein hx of the same states was taking a shower.  Active bleeding noted at this time, pressure dressing applied.

## 2016-05-17 NOTE — ED Notes (Signed)
Pt with currently active bleeding varicose vein to left leg, pressure wrapped at this time. Pt denies lightheadedness, dizziness.

## 2016-05-17 NOTE — ED Provider Notes (Signed)
[facility] Emergency Department Provider Note  ____________________________________________  Time seen: Approximately 8:01 PM  I have reviewed the triage vital signs and the nursing notes.   HISTORY  Chief Complaint Wound Check    HPI CHERALYN OLIVER is a 45 y.o. female with a history of varicose veins who is not anticoagulated presenting with bleeding varicose vein. The patient was in the shower when she nicked her varicose vein with her long nails. She was unable to get the bleeding controlled at home. She denies any lightheadedness, shortness of breath or chest pain.   Past Medical History  Diagnosis Date  . Complication of anesthesia     slow to wake up - once   . Hypertension   . Anxiety   . Arthritis   . Varicose veins     Patient Active Problem List   Diagnosis Date Noted  . S/P right TKA 06/09/2014    Past Surgical History  Procedure Laterality Date  . Abdominal hysterectomy      partial   . Right knee partial replacemnt     . Joint replacement      left   . Left hip surgery       rod left hip to knee   . Back surgery      L5-S1   . Total knee revision Right 06/09/2014    Procedure: RIGHT TOTAL KNEE REVISION ;  Surgeon: Shelda Pal, MD;  Location: [facility] ORS;  Service: Orthopedics;  Laterality: Right;    Current Outpatient Rx  Name  Route  Sig  Dispense  Refill  . acetaminophen (TYLENOL) 325 MG tablet   Oral   Take 1-2 tablets (325-650 mg total) by mouth every 6 (six) hours as needed.         . ALPRAZolam (XANAX) 1 MG tablet   Oral   Take 1 mg by mouth 3 (three) times daily as needed for anxiety.         . cetirizine (ZYRTEC) 10 MG tablet   Oral   Take 10 mg by mouth daily.         . Cyanocobalamin (VITAMIN B 12 PO)   Oral   Take 1 tablet by mouth daily.         Marland Kitchen docusate sodium 100 MG CAPS   Oral   Take 100 mg by mouth 2 (two) times daily.   10 capsule   0   . DULoxetine (CYMBALTA) 60 MG capsule    Oral   Take 60 mg by mouth every morning.         . Eszopiclone 3 MG TABS   Oral   Take 3 mg by mouth at bedtime. Take immediately before bedtime         . fentaNYL (DURAGESIC - DOSED MCG/HR) 50 MCG/HR   Transdermal   Place 50 mcg onto the skin every other day.         . ferrous sulfate 325 (65 FE) MG tablet   Oral   Take 1 tablet (325 mg total) by mouth 3 (three) times daily after meals.      3   . losartan (COZAAR) 50 MG tablet   Oral   Take 50 mg by mouth every morning.         . metoprolol succinate (TOPROL-XL) 50 MG 24 hr tablet   Oral   Take 50 mg by mouth every morning. Take with or immediately following a meal.         .  oxyCODONE (OXY IR/ROXICODONE) 5 MG immediate release tablet   Oral   Take 1-3 tablets (5-15 mg total) by mouth every 4 (four) hours as needed for severe pain.   120 tablet   0   . polyethylene glycol (MIRALAX / GLYCOLAX) packet   Oral   Take 17 g by mouth 2 (two) times daily.   14 each   0   . pregabalin (LYRICA) 100 MG capsule   Oral   Take 100 mg by mouth at bedtime.         . rivaroxaban (XARELTO) 10 MG TABS tablet   Oral   Take 1 tablet (10 mg total) by mouth daily.   14 tablet   0   . tiZANidine (ZANAFLEX) 4 MG tablet   Oral   Take 1 tablet (4 mg total) by mouth 2 (two) times daily.   30 tablet   0     Allergies Penicillins and Aspirin  Family History  Problem Relation Age of Onset  . Hypertension Mother     Social History Social History  Substance Use Topics  . Smoking status: Current Every Day Smoker -- 0.50 packs/day for 15 years    Types: Cigarettes  . Smokeless tobacco: Never Used  . Alcohol Use: Yes     Comment: occasional     Review of Systems Constitutional: No fever/chills. Eyes: No visual changes. ENT: No sore throat. No congestion or rhinorrhea. Cardiovascular: Denies chest pain. Denies palpitations. Respiratory: Denies shortness of breath.  No cough. Gastrointestinal: No abdominal  pain.  No nausea, no vomiting.  Musculoskeletal: Negative for back pain. Chronic left hip pain. Skin: Negative for rash. Neurological: Negative for headaches. No focal numbness, tingling or weakness.  Hematological/Lymphatic:Ongoing bleeding from varicose vein.  10-point ROS otherwise negative.  ____________________________________________   PHYSICAL EXAM:  VITAL SIGNS: ED Triage Vitals  Enc Vitals Group     BP 05/17/16 1921 121/76 mmHg     Pulse Rate 05/17/16 1921 110     Resp 05/17/16 1921 20     Temp 05/17/16 1921 97.8 F (36.6 C)     Temp Source 05/17/16 1921 Oral     SpO2 05/17/16 1921 98 %     Weight 05/17/16 1921 140 lb (63.504 kg)     Height 05/17/16 1921 5\' 2"  (1.575 m)     Head Cir --      Peak Flow --      Pain Score --      Pain Loc --      Pain Edu? --      Excl. in GC? --     Constitutional: Alert and oriented. Chronically ill appearing but nontoxic.Marland Kitchen. Answers questions appropriately. Eyes: Conjunctivae are normal.  EOMI. No scleral icterus. Head: Atraumatic. Nose: No congestion/rhinnorhea. Mouth/Throat: Mucous membranes are moist.  Neck: No stridor.  Supple.   Cardiovascular: Normal rate, .  Respiratory: Normal respiratory effort.   Musculoskeletal: No LE edema.  Neurologic:  A&Ox3.  Speech is clear.  Face and smile are symmetric.  EOMI.  Moves all extremities well. Skin:  Skin is warm, dry. On the left lateral aspect of the lower leg 6 inches below the patella, the patient has a varicosity at the surface that has a punctate open area that is sending out a continuous stream of blood. Psychiatric: Mood and affect are normal. Speech and behavior are normal.  Normal judgement.  ____________________________________________   LABS (all labs ordered are listed, but only abnormal results are displayed)  Labs  Reviewed - No data to display ____________________________________________  EKG  Not  indicated ____________________________________________  RADIOLOGY  No results found.  ____________________________________________   PROCEDURES  Procedure(s) performed: None  Critical Care performed: No ____________________________________________   INITIAL IMPRESSION / ASSESSMENT AND PLAN / ED COURSE  Pertinent labs & imaging results that were available during my care of the patient were reviewed by me and considered in my medical decision making (see chart for details).  45 y.o. female with a history of varicose veins who is not anticoagulated presenting with ongoing bleeding after nicking her varicose vein with her fingernail. We will plan to anesthetize the patient and oversew the varicosity.  ----------------------------------------- 8:04 PM on 05/17/2016 -----------------------------------------  LACERATION REPAIR Performed by: Rockne Menghini Authorized by: Rockne Menghini Consent: Verbal consent obtained. Risks and benefits: risks, benefits and alternatives were discussed Consent given by: patient Patient identity confirmed: provided demographic data Prepped and Draped in normal sterile fashion Wound explored  Laceration Location: left lower leg  Laceration Length: 3mm  No Foreign Bodies seen or palpated  Anesthesia: local infiltration  Local anesthetic: lidocaine 1% with epinephrine  Anesthetic total: 2 ml  Irrigation method: syringe Amount of cleaning: standard  Skin closure: 4-0 Prolene  Number of sutures: 1  Technique: simple interrupted  Patient tolerance: Patient tolerated the procedure well with no immediate complications.   ____________________________________________  FINAL CLINICAL IMPRESSION(S) / ED DIAGNOSES  Final diagnoses:  Bleeding from varicose vein, left      NEW MEDICATIONS STARTED DURING THIS VISIT:  New Prescriptions   No medications on file     Rockne Menghini, MD 05/17/16 2005

## 2016-05-17 NOTE — Discharge Instructions (Signed)
Please make an appointment for suture removal with your primary care physician in 5-7 days.  Return to the emergency department if you develop severe pain, bleeding that is not controlled with pressure, lightheadedness, shortness of breath, or any other symptoms concerning to you.

## 2016-06-15 IMAGING — CR DG CHEST 2V
2 series · 2 of 2 positions shown · non-contrast
Comparison: Chest x-ray of 08/27/2008

CLINICAL DATA: Preop for right knee revision

EXAM:
CHEST  2 VIEW

[w chest pa]
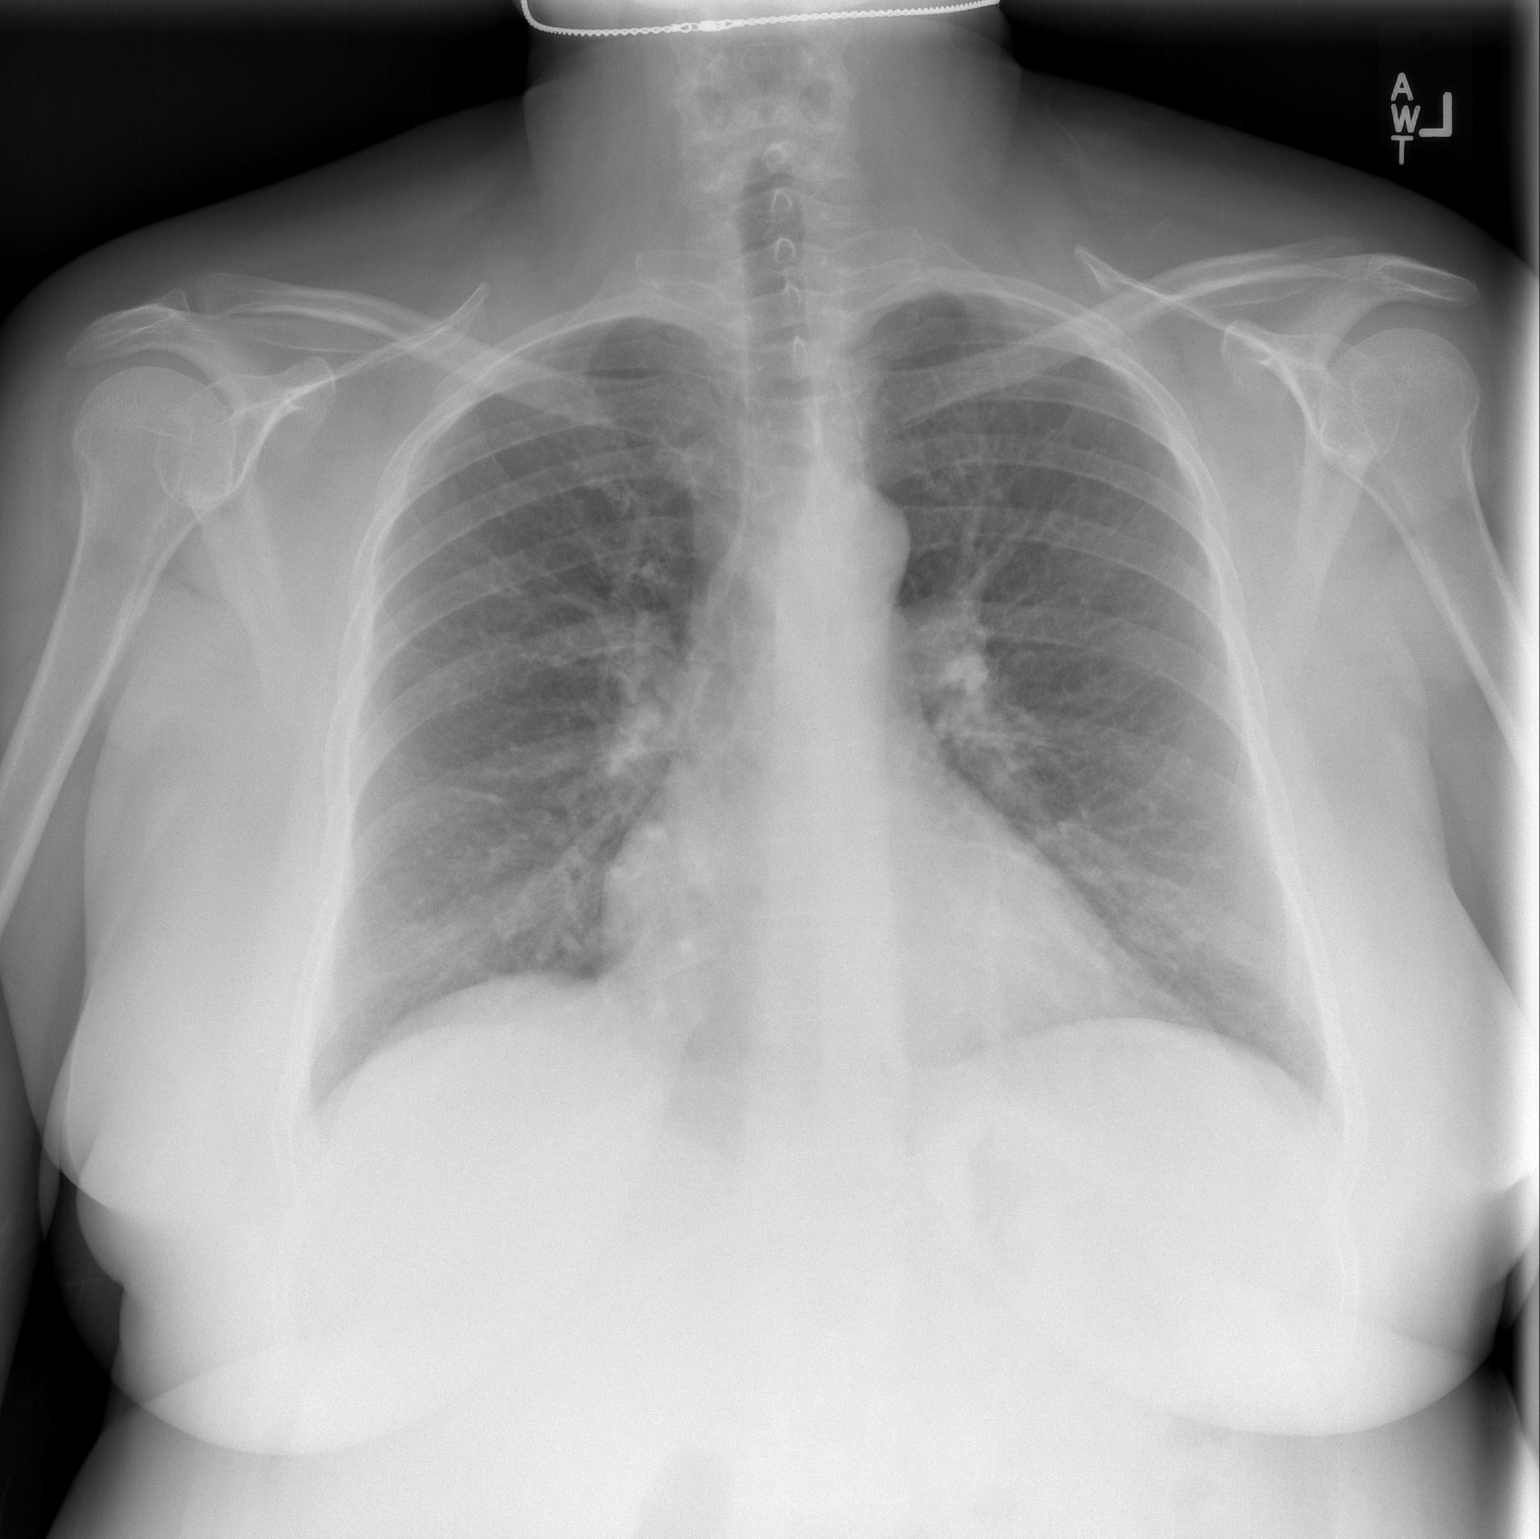

[w chest lat]
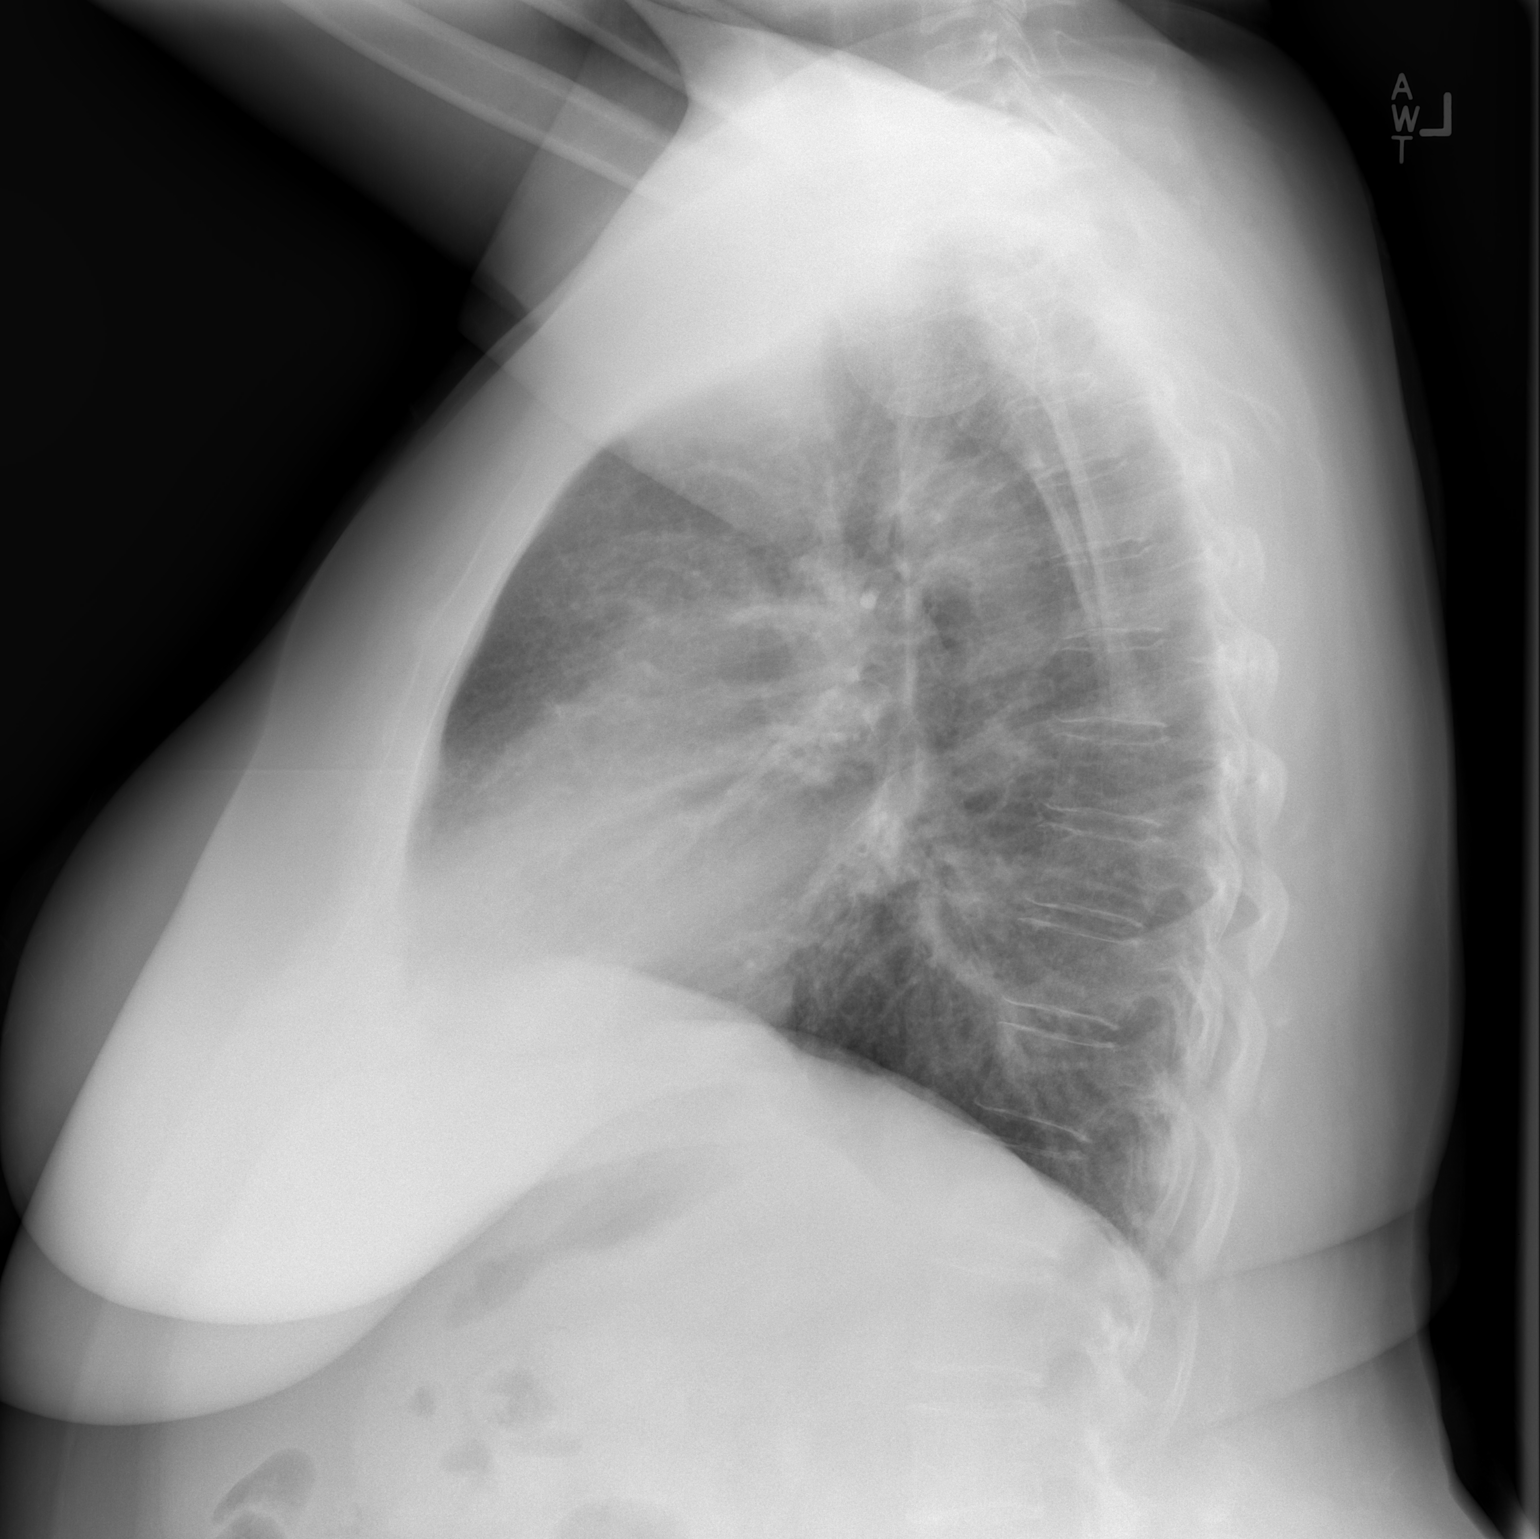

[2 of 2 positions shown; findings below may reference images not displayed]

FINDINGS: No active infiltrate or effusion is seen. There is some
peribronchial thickening which may indicate bronchitis. The heart is
within upper limits of normal. No bony abnormality is seen.
IMPRESSION: No active cardiopulmonary disease. Peribronchial thickening may
indicate bronchitis.

## 2017-12-19 DIAGNOSIS — M62830 Muscle spasm of back: Secondary | ICD-10-CM | POA: Diagnosis not present

## 2017-12-19 DIAGNOSIS — G894 Chronic pain syndrome: Secondary | ICD-10-CM | POA: Diagnosis not present

## 2017-12-19 DIAGNOSIS — M47817 Spondylosis without myelopathy or radiculopathy, lumbosacral region: Secondary | ICD-10-CM | POA: Diagnosis not present

## 2018-01-18 DIAGNOSIS — M47817 Spondylosis without myelopathy or radiculopathy, lumbosacral region: Secondary | ICD-10-CM | POA: Diagnosis not present

## 2018-01-18 DIAGNOSIS — R300 Dysuria: Secondary | ICD-10-CM | POA: Diagnosis not present

## 2018-01-18 DIAGNOSIS — M62830 Muscle spasm of back: Secondary | ICD-10-CM | POA: Diagnosis not present

## 2018-01-18 DIAGNOSIS — Z118 Encounter for screening for other infectious and parasitic diseases: Secondary | ICD-10-CM | POA: Diagnosis not present

## 2018-01-18 DIAGNOSIS — N390 Urinary tract infection, site not specified: Secondary | ICD-10-CM | POA: Diagnosis not present

## 2018-01-18 DIAGNOSIS — Z79899 Other long term (current) drug therapy: Secondary | ICD-10-CM | POA: Diagnosis not present

## 2018-01-18 DIAGNOSIS — G4700 Insomnia, unspecified: Secondary | ICD-10-CM | POA: Diagnosis not present

## 2018-01-18 DIAGNOSIS — G894 Chronic pain syndrome: Secondary | ICD-10-CM | POA: Diagnosis not present

## 2018-02-19 DIAGNOSIS — M47817 Spondylosis without myelopathy or radiculopathy, lumbosacral region: Secondary | ICD-10-CM | POA: Diagnosis not present

## 2018-02-19 DIAGNOSIS — G894 Chronic pain syndrome: Secondary | ICD-10-CM | POA: Diagnosis not present

## 2018-02-19 DIAGNOSIS — M62830 Muscle spasm of back: Secondary | ICD-10-CM | POA: Diagnosis not present

## 2018-02-26 DIAGNOSIS — M25552 Pain in left hip: Secondary | ICD-10-CM | POA: Diagnosis not present

## 2018-02-26 DIAGNOSIS — Z981 Arthrodesis status: Secondary | ICD-10-CM | POA: Diagnosis not present

## 2018-02-26 DIAGNOSIS — Z96653 Presence of artificial knee joint, bilateral: Secondary | ICD-10-CM | POA: Diagnosis not present

## 2018-02-26 DIAGNOSIS — M160 Bilateral primary osteoarthritis of hip: Secondary | ICD-10-CM | POA: Diagnosis not present

## 2018-02-26 DIAGNOSIS — R918 Other nonspecific abnormal finding of lung field: Secondary | ICD-10-CM | POA: Diagnosis not present

## 2018-02-26 DIAGNOSIS — Z969 Presence of functional implant, unspecified: Secondary | ICD-10-CM | POA: Diagnosis not present

## 2018-02-26 DIAGNOSIS — Z87312 Personal history of (healed) stress fracture: Secondary | ICD-10-CM | POA: Diagnosis not present

## 2018-02-26 DIAGNOSIS — Z8781 Personal history of (healed) traumatic fracture: Secondary | ICD-10-CM | POA: Diagnosis not present

## 2018-02-26 DIAGNOSIS — M1612 Unilateral primary osteoarthritis, left hip: Secondary | ICD-10-CM | POA: Diagnosis not present

## 2018-02-26 DIAGNOSIS — J40 Bronchitis, not specified as acute or chronic: Secondary | ICD-10-CM | POA: Diagnosis not present

## 2018-03-22 DIAGNOSIS — G894 Chronic pain syndrome: Secondary | ICD-10-CM | POA: Diagnosis not present

## 2018-03-22 DIAGNOSIS — M62830 Muscle spasm of back: Secondary | ICD-10-CM | POA: Diagnosis not present

## 2018-03-22 DIAGNOSIS — M47817 Spondylosis without myelopathy or radiculopathy, lumbosacral region: Secondary | ICD-10-CM | POA: Diagnosis not present

## 2018-04-20 DIAGNOSIS — H1089 Other conjunctivitis: Secondary | ICD-10-CM | POA: Diagnosis not present

## 2018-04-20 DIAGNOSIS — J069 Acute upper respiratory infection, unspecified: Secondary | ICD-10-CM | POA: Diagnosis not present

## 2018-04-20 DIAGNOSIS — R0981 Nasal congestion: Secondary | ICD-10-CM | POA: Diagnosis not present

## 2018-04-23 DIAGNOSIS — G894 Chronic pain syndrome: Secondary | ICD-10-CM | POA: Diagnosis not present

## 2018-04-23 DIAGNOSIS — M62830 Muscle spasm of back: Secondary | ICD-10-CM | POA: Diagnosis not present

## 2018-04-23 DIAGNOSIS — Z79891 Long term (current) use of opiate analgesic: Secondary | ICD-10-CM | POA: Diagnosis not present

## 2018-04-23 DIAGNOSIS — M47817 Spondylosis without myelopathy or radiculopathy, lumbosacral region: Secondary | ICD-10-CM | POA: Diagnosis not present

## 2018-05-24 DIAGNOSIS — J069 Acute upper respiratory infection, unspecified: Secondary | ICD-10-CM | POA: Diagnosis not present

## 2018-05-24 DIAGNOSIS — G4700 Insomnia, unspecified: Secondary | ICD-10-CM | POA: Diagnosis not present

## 2018-05-25 DIAGNOSIS — M62830 Muscle spasm of back: Secondary | ICD-10-CM | POA: Diagnosis not present

## 2018-05-25 DIAGNOSIS — M47817 Spondylosis without myelopathy or radiculopathy, lumbosacral region: Secondary | ICD-10-CM | POA: Diagnosis not present

## 2018-05-25 DIAGNOSIS — G894 Chronic pain syndrome: Secondary | ICD-10-CM | POA: Diagnosis not present

## 2018-05-28 DIAGNOSIS — Z01810 Encounter for preprocedural cardiovascular examination: Secondary | ICD-10-CM | POA: Diagnosis not present

## 2018-05-28 DIAGNOSIS — M1612 Unilateral primary osteoarthritis, left hip: Secondary | ICD-10-CM | POA: Diagnosis not present

## 2018-05-28 DIAGNOSIS — M25552 Pain in left hip: Secondary | ICD-10-CM | POA: Diagnosis not present

## 2018-05-28 DIAGNOSIS — Z01812 Encounter for preprocedural laboratory examination: Secondary | ICD-10-CM | POA: Diagnosis not present

## 2018-05-28 DIAGNOSIS — I10 Essential (primary) hypertension: Secondary | ICD-10-CM | POA: Diagnosis not present

## 2018-05-28 DIAGNOSIS — Z01818 Encounter for other preprocedural examination: Secondary | ICD-10-CM | POA: Diagnosis not present

## 2018-06-01 DIAGNOSIS — Z96659 Presence of unspecified artificial knee joint: Secondary | ICD-10-CM | POA: Diagnosis not present

## 2018-06-01 DIAGNOSIS — M1712 Unilateral primary osteoarthritis, left knee: Secondary | ICD-10-CM | POA: Diagnosis not present

## 2018-06-01 DIAGNOSIS — M1612 Unilateral primary osteoarthritis, left hip: Secondary | ICD-10-CM | POA: Diagnosis not present

## 2018-06-01 DIAGNOSIS — M25552 Pain in left hip: Secondary | ICD-10-CM | POA: Diagnosis not present

## 2018-06-01 DIAGNOSIS — Z969 Presence of functional implant, unspecified: Secondary | ICD-10-CM | POA: Diagnosis not present

## 2018-06-01 DIAGNOSIS — I10 Essential (primary) hypertension: Secondary | ICD-10-CM | POA: Diagnosis not present

## 2018-06-20 DIAGNOSIS — E7800 Pure hypercholesterolemia, unspecified: Secondary | ICD-10-CM | POA: Diagnosis not present

## 2018-06-20 DIAGNOSIS — S5002XA Contusion of left elbow, initial encounter: Secondary | ICD-10-CM | POA: Diagnosis not present

## 2018-06-20 DIAGNOSIS — I10 Essential (primary) hypertension: Secondary | ICD-10-CM | POA: Diagnosis not present

## 2018-06-20 DIAGNOSIS — M4310 Spondylolisthesis, site unspecified: Secondary | ICD-10-CM | POA: Diagnosis not present

## 2018-06-20 DIAGNOSIS — E559 Vitamin D deficiency, unspecified: Secondary | ICD-10-CM | POA: Diagnosis not present

## 2018-06-20 DIAGNOSIS — E539 Vitamin B deficiency, unspecified: Secondary | ICD-10-CM | POA: Diagnosis not present

## 2018-06-20 DIAGNOSIS — Z01818 Encounter for other preprocedural examination: Secondary | ICD-10-CM | POA: Diagnosis not present

## 2018-06-28 DIAGNOSIS — G894 Chronic pain syndrome: Secondary | ICD-10-CM | POA: Diagnosis not present

## 2018-06-28 DIAGNOSIS — M62830 Muscle spasm of back: Secondary | ICD-10-CM | POA: Diagnosis not present

## 2018-06-28 DIAGNOSIS — M47817 Spondylosis without myelopathy or radiculopathy, lumbosacral region: Secondary | ICD-10-CM | POA: Diagnosis not present

## 2018-10-18 ENCOUNTER — Ambulatory Visit: Payer: Self-pay | Admitting: Occupational Therapy

## 2018-10-23 ENCOUNTER — Ambulatory Visit: Payer: Medicare Other | Attending: Internal Medicine | Admitting: Occupational Therapy

## 2018-10-23 ENCOUNTER — Encounter: Payer: Self-pay | Admitting: Occupational Therapy

## 2018-10-23 ENCOUNTER — Other Ambulatory Visit: Payer: Self-pay

## 2018-10-23 DIAGNOSIS — I890 Lymphedema, not elsewhere classified: Secondary | ICD-10-CM | POA: Diagnosis not present

## 2018-10-23 NOTE — Patient Instructions (Signed)

## 2018-10-25 ENCOUNTER — Ambulatory Visit: Payer: Medicare Other | Admitting: Occupational Therapy

## 2018-10-25 NOTE — Therapy (Signed)
[facility] MAIN [facility] [place] SERVICES [address] [place], [place], [address] Phone: [phone]   Fax:  [phone]  Occupational Therapy Evaluation  Patient Details  Name: Wendy Henderson MRN: [id number] Date of Birth: 1971/05/28 Referring Provider (OT): Zara Council, MD (2016)   Encounter Date: 10/23/2018    Past Medical History:  Diagnosis Date  . Anxiety   . Arthritis   . Complication of anesthesia    slow to wake up - once   . Hypertension   . Varicose veins     Past Surgical History:  Procedure Laterality Date  . ABDOMINAL HYSTERECTOMY     partial   . BACK SURGERY     L5-S1   . JOINT REPLACEMENT     left   . left hip surgery      rod left hip to knee   . right knee partial replacemnt     . TOTAL KNEE REVISION Right 06/09/2014   Procedure: RIGHT TOTAL KNEE REVISION ;  Surgeon: Shelda Pal, MD;  Location: [facility];  Service: Orthopedics;  Laterality: Right;    There were no vitals filed for this visit.  Subjective Assessment - 10/25/18 0925    Subjective   Ms. Wendy Henderson is referred to Occupational Therapy by Zara Council, MD, for evaluation and treatment of BLE lymphedema. Pt reports onset in 2016 after R knee sx and subsequent episode of BLE cellulitis. Pt reports worsening leg swelling and associated pain over time and worsening as the day progresses. Pt denies known family history of leg swelling. Exacerbating include extended standing, walking and dependent positioning. Pt has not undergone previous lymphedema treatment and she does not wear compression garments. Pt's goals for treatment are to reduce leg swelling and pain and keep it from getting worse.     Pertinent History  hx bleeding of varicose veins, HTN, hx anxiety, OA, avascular necrosis of L hip, L hip replacement, s/p spinal fusion, S/P R knee partial replacement, L hip sx rod to knee, R total knee revision,  chronic pain, hx recurrent cellulitis by report  (not noted in EMR), current everyday smoker    Limitations  difficulty walking, chronic pain, chronic BLE swelling,     Repetition  Increases Symptoms    Currently in Pain?  Yes    Pain Score  6     Pain Location  Leg    Pain Orientation  Left;Right    Pain Descriptors / Indicators  Pins and needles;Heaviness;Discomfort;Tightness;Tiring;Other (Comment)   fullness   Pain Type  Chronic pain    Pain Onset  Other (comment)   BLE swelling and associated pain reported 2016 s/p R knee sx   Pain Frequency  Intermittent    Aggravating Factors   standing, walking, dependent positioning    Pain Relieving Factors  elevation    Effect of Pain on Daily Activities  Pt has multiple sources of chronic pain which limits ambulation and functional mobility, basic and instrumental ADLs performance, leisure pursuits, productive activities, social participation and role performance.    Multiple Pain Sites  Yes        [facility] OT Assessment - 10/25/18 0001      Assessment   Medical Diagnosis  Mild, stage 2, BLE lymphedema (LE) 2/2 suspected venous insufficiency, chronic inflamatory processes and surgical trauma    Referring Provider (OT)  Zara Council, MD   2016   Onset Date/Surgical Date  --   2016 s/p R knee sx and  episode of cellulitis   Prior Therapy  no CDT or compression garments      Precautions   Precautions  Fall    Precaution Comments  --   LE skin precautions     Restrictions   Weight Bearing Restrictions  No      Balance Screen   Has the patient fallen in the past 6 months  No    Has the patient had a decrease in activity level because of a fear of falling?   Yes      Home  Environment   Lives With  Other (Comment)   housemate     Prior Function   Level of Independence  Independent    Vocation  On disability      IADL   Prior Level of Function Shopping  I    Shopping  Assistance for transportation;Needs to be accompanied on any shopping trip    Prior Level of Function Light  Housekeeping  I    Light Housekeeping  Needs help with all home maintenance tasks    Prior Level of Function Meal Prep  I    Meal Prep  Able to complete simple cold meal and snack prep    Prior Level of Function Community Mobility  I    Community Mobility  Relies on family or friends for transportation    Prior Level of Function Medication Managment  I    Medication Management  Is responsible for taking medication in correct dosages at correct time      Mobility   Mobility Status  History of falls;Needs assist      Activity Tolerance   Activity Tolerance  Endurance does not limit participation in activity    Activity Tolerance Comments  Pain and leg swelling limit standing, walking, sitting and supine tolerance      Cognition   Overall Cognitive Status  Within Functional Limits for tasks assessed    Behaviors  Verbal agitation      Posture/Postural Control   Posture/Postural Control  No significant limitations      Sensation   Light Touch  Appears Intact      Coordination   Gross Motor Movements are Fluid and Coordinated  Yes    Fine Motor Movements are Fluid and Coordinated  Yes      ROM / Strength   AROM / PROM / Strength  AROM;PROM;Strength      AROM   Overall AROM   Deficits    AROM Assessment Site  Hip;Knee;Ankle;Cervical;Lumbar;Thoracic      PROM   Overall PROM   Deficits    PROM Assessment Site  Hip;Knee;Ankle;Cervical;Lumbar;Thoracic      Strength   Overall Strength  Deficits    Strength Assessment Site  Hip;Knee;Ankle      Hand Function   Right Hand Gross Grasp  Functional    Left Hand Gross Grasp  Functional                      OT Education - 10/25/18 [id number]    Education Details  Provided Pt and family skilled education regarding lymphatic structure and function, lymphedema etiologies, onset patterns and stages of progression. Discussed  impact of obesity on lymphatic system function. Outlined Complete Decongestive Therapy (CDT)  as standard  of LE care and provided in depth information regarding 4 primary components of both Intensive and Self Management Phases, including Manual Lymph Drainage (MLD), compression wrapping and garments, skin care, and therapeutic exercise.  Discussed   Importance of daily, ongoing LE self-care essential to retaining clinical gains and limiting progression.  Lastly, reviewed lymphedema precautions, including cellulitis risk and difficulty with wound healing. Provided printed Lymphedema Workbook for reference.    Person(s) Educated  Patient    Methods  Explanation;Demonstration;Verbal cues;Handout    Comprehension  Verbalized understanding;Returned demonstration;Verbal cues required;Tactile cues required;Need further instruction          OT Long Term Goals - 10/25/18 1223      OT LONG TERM GOAL #1   Title  Pt will demonstrate understanding of lymphedema (LE) precautions / prevention principals, including signs / symptoms of cellulitis infection with modified independence using LE Workbook as printed reference to identify 6 precautions without verbal cues by end of 3rd  OT Rx visit.     Baseline  Max A    Time  3    Period  Days    Status  New    Target Date  --   3rd OT visit     OT LONG TERM GOAL #2   Title  Pt will be able to apply multi-layer short stretch compression wraps using correct gradient techniques with independently , but with extra time  to achieve optimal limb volume reduction during Intensive Phase CDT, and to return affected limb(s) , as closely as possible, to premorbid size and shape.    Baseline  dependent    Time  2    Period  Weeks    Status  New    Target Date  --   10th OT visit     OT LONG TERM GOAL #3   Title  Pt to achieve no less than 10% limb volume reduction in affected limb(s) during Intensive Phase CDT to control limb swelling, to improve tissue integrity and immune function, to improve ADLs performance and to improve functional mobility/ transfers.    Baseline   Dependent    Time  12    Period  Weeks    Status  New    Target Date  01/21/19      OT LONG TERM GOAL #4   Baseline  Pt will achieve 100% compliance with daily LE self-care home program components, including proper skin care, simple self-MLD, gradient compression wraps/ garments, and therapeutic exercise to ensure optimal Intensive Phase limb volume reduction to expedite compression garment/ device fitting.    Period  Weeks    Status  New    Target Date  01/21/19      OT LONG TERM GOAL #5   Title  Pt will demonstrate competent use of assistive devices during LE self-care training to improve ability to don and doff compression garments/devices with modified assistance to ensure optimal LE self-management over time.    Baseline  Max A    Time  12    Period  Weeks    Status  New    Target Date  01/21/19      Long Term Additional Goals   Additional Long Term Goals  Yes      OT LONG TERM GOAL #6   Title  Pt will retain optimal limb volume reductions achieved during Intensive Phase CDT with no more than 3% volume increase  without CG assistance to limit LE progression and further functional decline.    Baseline  Max A    Time  6    Period  Months    Status  New    Target Date  04/21/19  Plan - 10/25/18 1218    Clinical Impression Statement  DECHELLE ATTAWAY is a 47 year-old female presenting with mild, stage II, BLE lymphedema (LE) secondary to suspected venous insufficiency and multiple, chronic inflammatory processes. Stage 2 LE  is a chronic, progressive, incurable condition characterized by swelling and  soft tissues changes resulting from protein-rich tissue  fibrosis deposition.  Leg swelling and associated pain limits Pt's ability to ambulate to complete functional transfers and to drive. It limits skin flexibility and AROM at knees, ankles and feet. It impairs Pt's ability to fit LB clothing and street shoes, to perform home management activities (house cleaning  and cooking), to perform productive activities and leisure pursuits, and to participate in social activities. Ms Scobey will benefit from Occupational Therapy for Intensive and Management Phase Complete Decongestive Therapy (CDT), to include manual lymphatic drainage (MLD), skin care, therapeutic exercise and compression therapy. Emphasis of OT will be  on Pt education for long-term LE self-management. Without skilled OT for CDT LE will progress and infection risk will increase and further functional decline is expected. In addition to lymphedema care, Pt will benefit from dermatology consult for treatment of stasis dermatitis to limit recurrent infection.    Occupational Profile and client history currently impacting functional performance  chronic pain    Occupational performance deficits (Please refer to evaluation for details):  ADL's;IADL's;Leisure;Rest and Sleep;Social Participation;Work    Rehab Potential  Good    Current Impairments/barriers affecting progress:  chronic pain    OT Frequency  3x / week    OT Duration  12 weeks    OT Treatment/Interventions  Self-care/ADL training;Therapeutic exercise;DME and/or AE instruction;Building services engineer;Compression bandaging;Coping strategies training;Manual Therapy;Manual lymph drainage;Therapeutic activities    Plan  Fit with appropriate daytime compression garments and HOS devices.     Clinical Decision Making  Several treatment options, min-mod task modification necessary    Recommended Other Services  Dermatology to treat stasis dermatitis    Consulted and Agree with Plan of Care  Patient       Patient will benefit from skilled therapeutic intervention in order to improve the following deficits and impairments:  Decreased balance, Decreased mobility, Decreased skin integrity, Difficulty walking, Decreased coping skills, Decreased knowledge of precautions, Decreased range of motion, Increased edema, Impaired perceived functional ability,  Pain, Decreased activity tolerance, Decreased knowledge of use of DME, Decreased strength, Impaired flexibility  Visit Diagnosis: Lymphedema, not elsewhere classified - Plan: Ot plan of care cert/re-cert    Problem List Patient Active Problem List   Diagnosis Date Noted  . 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 [place], [place], [address] Phone: [phone]   Fax:  [phone]  Name: KIKI BIVENS MRN: [id number] Date of Birth: June 18, 1971

## 2018-10-29 ENCOUNTER — Ambulatory Visit: Payer: Medicare Other | Admitting: Occupational Therapy

## 2018-10-29 DIAGNOSIS — I890 Lymphedema, not elsewhere classified: Secondary | ICD-10-CM

## 2018-10-29 NOTE — Therapy (Signed)
[facility] MAIN [facility] SERVICES [address] [place], [place], [address] Phone: [phone]   Fax:  [phone]  Occupational Therapy Treatment  Patient Details  Name: Wendy Henderson MRN: [id number] Date of Birth: 1971/03/10 Referring Provider (OT): Zara Council, MD [phone])   Encounter Date: 10/29/2018  OT End of Session - 10/29/18 1551    Visit Number  2    Number of Visits  36    Date for OT Re-Evaluation  01/21/19    OT Start Time  1115    OT Stop Time  1235    OT Time Calculation (min)  80 min    Activity Tolerance  Patient tolerated treatment well;No increased pain    Behavior During Therapy  WFL for tasks assessed/performed       Past Medical History:  Diagnosis Date  . Anxiety   . Arthritis   . Complication of anesthesia    slow to wake up - once   . Hypertension   . Varicose veins     Past Surgical History:  Procedure Laterality Date  . ABDOMINAL HYSTERECTOMY     partial   . BACK SURGERY     L5-S1   . JOINT REPLACEMENT     left   . left hip surgery      rod left hip to knee   . right knee partial replacemnt     . TOTAL KNEE REVISION Right 06/09/2014   Procedure: RIGHT TOTAL KNEE REVISION ;  Surgeon: Shelda Pal, MD;  Location: [facility];  Service: Orthopedics;  Laterality: Right;    There were no vitals filed for this visit.  Subjective Assessment - 10/29/18 1529    Subjective   Wendy Henderson presents for OT visit 2/36 for CDT to BLE. Treatment is applied to alternating LE with emphasis on LLE intially. Pt has no new complaints. She is eager to commence OT.    Pertinent History  hx bleeding of varicose veins, HTN, hx anxiety, OA, avascular necrosis of L hip, L hip replacement, s/p spinal fusion, S/P R knee partial replacement, L hip sx rod to knee, R total knee revision,  chronic pain, hx recurrent cellulitis by report (not noted in EMR), current everyday smoker    Limitations  difficulty walking, chronic pain, chronic  BLE swelling,     Repetition  Increases Symptoms    Currently in Pain?  Yes   Unchanged since 11/14 eval. Not rated numerically   Pain Location  Leg    Pain Orientation  Right;Left    Pain Type  Chronic pain    Pain Onset  Other (comment)   BLE swelling and associated pain reported 2016 s/p R knee sx         LYMPHEDEMA/ONCOLOGY QUESTIONNAIRE - 10/29/18 1534      Lymphedema Assessments   Lymphedema Assessments  Lower extremities      Right Lower Extremity Lymphedema   Other  LLE A-D ( ankle - knee) limb volume measures 3883.15 ml. LLE thigh m( E-G) segment volume measures 4071.96 ml. LLE full leg (A-G) limb volume measures 7955.11  ml.    Other  Limb volume differential (LVD) calculations reveal LLE is larger than dominant RLE at aboth leg and thigh sements and overall. LVD at A-D = 14.835, l>r. lvd at E-G SEGMENT = 4.67%., L>R; and LVG for full leg measures 9.63%, L>R.      Left Lower Extremity Lymphedema   Other  RLE A-D ( ankle -  knee) limb volume measures 3307.15 ml. RLE thigh m( E-G) segment volume measures 3881.76 ml. RLE full leg (A-G) limb volume measures 7188.92 ml.    Other  Limb volume differential (LVD) calculations reveal LLE is larger than dominant RLE at aboth leg and thigh sements and overall. LVD at A-D = 14.835, l>r. lvd at E-G SEGMENT = 4.67%., L>R; and LVG for full leg measures 9.63%, L>R.              OT Treatments/Exercises (OP) - 10/29/18 0001      ADLs   ADL Education Given  Yes      Manual Therapy   Manual Therapy  Edema management;Manual Lymphatic Drainage (MLD);Compression Bandaging    Manual therapy comments  Completed initial baseline limb volumetrics    Compression Bandaging  Knee length, gradient compression wrap applied to ALLE. Utilized 0.4 cm thiclk Rosidal foam layer ocver cotton stockinett applied circumferentially under 2 layers of short stretch wraps, 1 8 cm and 1 10 cm,. Stretch net over all.             OT Education -  10/29/18 1550    Education Details  Pt edu for volumetric measurements  to measure limb volumes, limb volume reduction going forward, and interpreting limb volume differential data .    Person(s) Educated  Patient    Methods  Explanation;Demonstration    Comprehension  Verbalized understanding;Need further instruction          OT Long Term Goals - 10/25/18 1223      OT LONG TERM GOAL #1   Title  Pt will demonstrate understanding of lymphedema (LE) precautions / prevention principals, including signs / symptoms of cellulitis infection with modified independence using LE Workbook as printed reference to identify 6 precautions without verbal cues by end of 3rd  OT Rx visit.     Baseline  Max A    Time  3    Period  Days    Status  New    Target Date  --   3rd OT visit     OT LONG TERM GOAL #2   Title  Pt will be able to apply multi-layer short stretch compression wraps using correct gradient techniques with independently , but with extra time  to achieve optimal limb volume reduction during Intensive Phase CDT, and to return affected limb(s) , as closely as possible, to premorbid size and shape.    Baseline  dependent    Time  2    Period  Weeks    Status  New    Target Date  --   10th OT visit     OT LONG TERM GOAL #3   Title  Pt to achieve no less than 10% limb volume reduction in affected limb(s) during Intensive Phase CDT to control limb swelling, to improve tissue integrity and immune function, to improve ADLs performance and to improve functional mobility/ transfers.    Baseline  Dependent    Time  12    Period  Weeks    Status  New    Target Date  01/21/19      OT LONG TERM GOAL #4   Baseline  Pt will achieve 100% compliance with daily LE self-care home program components, including proper skin care, simple self-MLD, gradient compression wraps/ garments, and therapeutic exercise to ensure optimal Intensive Phase limb volume reduction to expedite compression garment/ device  fitting.    Period  Weeks    Status  New  Target Date  01/21/19      OT LONG TERM GOAL #5   Title  Pt will demonstrate competent use of assistive devices during LE self-care training to improve ability to don and doff compression garments/devices with modified assistance to ensure optimal LE self-management over time.    Baseline  Max A    Time  12    Period  Weeks    Status  New    Target Date  01/21/19      Long Term Additional Goals   Additional Long Term Goals  Yes      OT LONG TERM GOAL #6   Title  Pt will retain optimal limb volume reductions achieved during Intensive Phase CDT with no more than 3% volume increase  without CG assistance to limit LE progression and further functional decline.    Baseline  Max A    Time  6    Period  Months    Status  New    Target Date  04/21/19            Plan - 10/29/18 1553    Clinical Impression Statement  Completed BLE, base line comparative limb volumetrics. RLE A-D ( ankle - knee) limb volume measures 3307.15 ml. RLE thigh m( E-G) segment volume measures 3881.76 ml. RLE full leg (A-G) limb volume measures 7188.92 ml. LLE (Rx limb #1) A-D segment measures 3883.15 ml. LLE E-G volume measures 4071.96 ml. LLE full leg (A-G) volume measures 7955.11 ml/ LLE (Rx limb #1) A-D segment measures 3883.15 ml. LLE E-G volume measures 4071.96 ml. LLE full leg (A-G) volume measures 7955.11 ml/.  Limb volume differential (LVD) calculations reveal LLE is larger than dominant RLE at aboth leg and thigh sements and overall. LVD at A-D = 14.835, l>r. lvd at E-G SEGMENT = 4.67%., L>R; and LVG for full leg measures 9.63%, L>R. Applied LLE, knee length, gradient compression wraps utilizing multilayer gradient technique. Pt instructed to remove wraps in the event of acute pain and/ or swelling , or SOB. Cont as per POC.    Occupational Profile and client history currently impacting functional performance  chronic pain    Occupational performance deficits  (Please refer to evaluation for details):  ADL's;IADL's;Leisure;Rest and Sleep;Social Participation;Work    Rehab Potential  Good    Current Impairments/barriers affecting progress:  chronic pain    OT Frequency  3x / week    OT Duration  12 weeks    OT Treatment/Interventions  Self-care/ADL training;Therapeutic exercise;DME and/or AE instruction;Building services engineerunctional Mobility Training;Compression bandaging;Coping strategies training;Manual Therapy;Manual lymph drainage;Therapeutic activities    Plan  Fit with appropriate daytime compression garments and HOS devices.     Clinical Decision Making  Several treatment options, min-mod task modification necessary    Recommended Other Services  Dermatology to treat stasis dermatitis    Consulted and Agree with Plan of Care  Patient       Patient will benefit from skilled therapeutic intervention in order to improve the following deficits and impairments:  Decreased balance, Decreased mobility, Decreased skin integrity, Difficulty walking, Decreased coping skills, Decreased knowledge of precautions, Decreased range of motion, Increased edema, Impaired perceived functional ability, Pain, Decreased activity tolerance, Decreased knowledge of use of DME, Decreased strength, Impaired flexibility  Visit Diagnosis: Lymphedema, not elsewhere classified    Problem List Patient Active Problem List   Diagnosis Date Noted  . 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 [place], [place], [address] Phone: [phone]   Fax:  [phone]  Name: Wendy Henderson MRN: [id number] Date of Birth: Apr 25, 1971

## 2018-10-31 ENCOUNTER — Ambulatory Visit: Payer: Medicare Other | Admitting: Occupational Therapy

## 2018-10-31 DIAGNOSIS — I890 Lymphedema, not elsewhere classified: Secondary | ICD-10-CM

## 2018-10-31 NOTE — Therapy (Signed)
[facility] [address] [place], [place], [address] Phone: [phone]   Fax:  [phone]  Occupational Therapy Treatment  Patient Details  Name: Wendy Henderson MRN: [id number] Date of Birth: 08/01/1971 Referring Provider (OT): Zara CouncilAdwait Silwal, MD [phone]([id number])   Encounter Date: 10/31/2018  OT End of Session - 10/31/18 1614    Visit Number  3    Number of Visits  36    Date for OT Re-Evaluation  01/21/19    OT Start Time  1100    OT Stop Time  1213    OT Time Calculation (min)  73 min    Activity Tolerance  Patient tolerated treatment well;No increased pain    Behavior During Therapy  WFL for tasks assessed/performed       Past Medical History:  Diagnosis Date  . Anxiety   . Arthritis   . Complication of anesthesia    slow to wake up - once   . Hypertension   . Varicose veins     Past Surgical History:  Procedure Laterality Date  . ABDOMINAL HYSTERECTOMY     partial   . BACK SURGERY     L5-S1   . JOINT REPLACEMENT     left   . left hip surgery      rod left hip to knee   . right knee partial replacemnt     . TOTAL KNEE REVISION Right 06/09/2014   Procedure: RIGHT TOTAL KNEE REVISION ;  Surgeon: Shelda PalMatthew D Olin, MD;  Location: [facility];  Service: Orthopedics;  Laterality: Right;    There were no vitals filed for this visit.  Subjective Assessment - 10/31/18 1613    Subjective   Wendy Henderson presents for OT visit 3/36 for CDT to BLE.Pt is accompanied by her housemate, Wendy Henderson, who will assist her with compression wraps between clinic  visits.    Pertinent History  hx bleeding of varicose veins, HTN, hx anxiety, OA, avascular necrosis of L hip, L hip replacement, s/p spinal fusion, S/P R knee partial replacement, L hip sx rod to knee, R total knee revision,  chronic pain, hx recurrent cellulitis by report (not noted in EMR), current everyday smoker    Limitations  difficulty walking, chronic pain, chronic BLE swelling,      Repetition  Increases Symptoms    Pain Onset  Other (comment)   BLE swelling and associated pain reported 2016 s/p R knee sx                          OT Education - 10/31/18 1614    Education Details  Pt and housemate edu for gradient compression wrapping    Person(s) Educated  Patient;Other (comment)    Methods  Explanation;Demonstration;Tactile cues;Verbal cues    Comprehension  Verbalized understanding;Need further instruction;Returned demonstration;Verbal cues required;Tactile cues required          OT Long Term Goals - 10/25/18 1223      OT LONG TERM GOAL #1   Title  Pt will demonstrate understanding of lymphedema (LE) precautions / prevention principals, including signs / symptoms of cellulitis infection with modified independence using LE Workbook as printed reference to identify 6 precautions without verbal cues by end of 3rd  OT Rx visit.     Baseline  Max A    Time  3    Period  Days    Status  New    Target Date  --  3rd OT visit     OT LONG TERM GOAL #2   Title  Pt will be able to apply multi-layer short stretch compression wraps using correct gradient techniques with independently , but with extra time  to achieve optimal limb volume reduction during Intensive Phase CDT, and to return affected limb(s) , as closely as possible, to premorbid size and shape.    Baseline  dependent    Time  2    Period  Weeks    Status  New    Target Date  --   10th OT visit     OT LONG TERM GOAL #3   Title  Pt to achieve no less than 10% limb volume reduction in affected limb(s) during Intensive Phase CDT to control limb swelling, to improve tissue integrity and immune function, to improve ADLs performance and to improve functional mobility/ transfers.    Baseline  Dependent    Time  12    Period  Weeks    Status  New    Target Date  01/21/19      OT LONG TERM GOAL #4   Baseline  Pt will achieve 100% compliance with daily LE self-care home program  components, including proper skin care, simple self-MLD, gradient compression wraps/ garments, and therapeutic exercise to ensure optimal Intensive Phase limb volume reduction to expedite compression garment/ device fitting.    Period  Weeks    Status  New    Target Date  01/21/19      OT LONG TERM GOAL #5   Title  Pt will demonstrate competent use of assistive devices during LE self-care training to improve ability to don and doff compression garments/devices with modified assistance to ensure optimal LE self-management over time.    Baseline  Max A    Time  12    Period  Weeks    Status  New    Target Date  01/21/19      Long Term Additional Goals   Additional Long Term Goals  Yes      OT LONG TERM GOAL #6   Title  Pt will retain optimal limb volume reductions achieved during Intensive Phase CDT with no more than 3% volume increase  without CG assistance to limit LE progression and further functional decline.    Baseline  Max A    Time  6    Period  Months    Status  New    Target Date  04/21/19            Plan - 10/31/18 1616    Clinical Impression Statement  Emphasis of visit on Pt and housemate edu for gradiehnt compression wrapping. Housemate able to apple LLE knee length wrap using correct techniques by end of session with max A. Cont as per POC.    Occupational Profile and client history currently impacting functional performance  chronic pain    Occupational performance deficits (Please refer to evaluation for details):  ADL's;IADL's;Leisure;Rest and Sleep;Social Participation;Work    Rehab Potential  Good    Current Impairments/barriers affecting progress:  chronic pain    OT Frequency  3x / week    OT Duration  12 weeks    OT Treatment/Interventions  Self-care/ADL training;Therapeutic exercise;DME and/or AE instruction;Building services engineer;Compression bandaging;Coping strategies training;Manual Therapy;Manual lymph drainage;Therapeutic activities    Plan   Fit with appropriate daytime compression garments and HOS devices.     Clinical Decision Making  Several treatment options, min-mod task modification necessary  Recommended Other Services  Dermatology to treat stasis dermatitis    Consulted and Agree with Plan of Care  Patient       Patient will benefit from skilled therapeutic intervention in order to improve the following deficits and impairments:  Decreased balance, Decreased mobility, Decreased skin integrity, Difficulty walking, Decreased coping skills, Decreased knowledge of precautions, Decreased range of motion, Increased edema, Impaired perceived functional ability, Pain, Decreased activity tolerance, Decreased knowledge of use of DME, Decreased strength, Impaired flexibility  Visit Diagnosis: Lymphedema, not elsewhere classified    Problem List Patient Active Problem List   Diagnosis Date Noted  . 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 [place], [place], [address] Phone: [phone]   Fax:  [phone]  Name: Wendy Henderson MRN: [id number] Date of Birth: 24-Aug-1971

## 2018-11-05 ENCOUNTER — Ambulatory Visit: Payer: Medicare Other | Admitting: Occupational Therapy

## 2018-11-05 DIAGNOSIS — I890 Lymphedema, not elsewhere classified: Secondary | ICD-10-CM | POA: Diagnosis not present

## 2018-11-06 NOTE — Therapy (Signed)
[place] [facility] MAIN [facility] SERVICES [address] [place], [place], [address] Phone: [phone]   Fax:  [phone]  Occupational Therapy Treatment  Patient Details  Name: Wendy Henderson MRN: [id number] Date of Birth: [birth year] Referring Provider (OT): Zara Council, MD [phone])   Encounter Date: 11/05/2018  OT End of Session - 11/05/18 1528    Visit Number  4    Number of Visits  36    Date for OT Re-Evaluation  01/21/19    OT Start Time  0206    OT Stop Time  0300    OT Time Calculation (min)  54 min    Activity Tolerance  Patient tolerated treatment well;No increased pain    Behavior During Therapy  WFL for tasks assessed/performed       Past Medical History:  Diagnosis Date  . Anxiety   . Arthritis   . Complication of anesthesia    slow to wake up - once   . Hypertension   . Varicose veins     Past Surgical History:  Procedure Laterality Date  . ABDOMINAL HYSTERECTOMY     partial   . BACK SURGERY     L5-S1   . JOINT REPLACEMENT     left   . left hip surgery      rod left hip to knee   . right knee partial replacemnt     . TOTAL KNEE REVISION Right 06/09/2014   Procedure: RIGHT TOTAL KNEE REVISION ;  Surgeon: Shelda Pal, MD;  Location: [facility];  Service: Orthopedics;  Laterality: Right;    There were no vitals filed for this visit.  Subjective Assessment - 11/05/18 1525    Subjective   Wendy Henderson presents for OT visit 4/36 for CDT to BLE.Pt is un accompanied . She rpresents with compression wraps in place.    Pertinent History  hx bleeding of varicose veins, HTN, hx anxiety, OA, avascular necrosis of L hip, L hip replacement, s/p spinal fusion, S/P R knee partial replacement, L hip sx rod to knee, R total knee revision,  chronic pain, hx recurrent cellulitis by report (not noted in EMR), current everyday smoker    Limitations  difficulty walking, chronic pain, chronic BLE swelling,     Repetition  Increases Symptoms     Pain Onset  Other (comment)   BLE swelling and associated pain reported 2016 s/p R knee sx                  OT Treatments/Exercises (OP) - 11/06/18 0001      ADLs   ADL Education Given  Yes      Manual Therapy   Manual Therapy  Edema management;Manual Lymphatic Drainage (MLD);Compression Bandaging    Manual Lymphatic Drainage (MLD)  Commenced LLE MOLD today using short neck, deep abdominal and functional inguinal pathways.     Compression Bandaging  Knee length, gradient compression wrap applied to ALLE. Utilized 0.4 cm thiclk Rosidal foam layer ocver cotton stockinett applied circumferentially under 2 layers of short stretch wraps, 1 8 cm and 1 10 cm,. Stretch net over all.             OT Education - 11/06/18 1527    Education Details  Cont Pt edu for apply compression. Introduced MLD structure , function and techniques for self massage    Person(s) Educated  Patient;Other (comment)    Methods  Explanation;Demonstration;Tactile cues;Verbal cues    Comprehension  Verbalized understanding;Need further  instruction;Returned demonstration;Verbal cues required;Tactile cues required          OT Long Term Goals - 10/25/18 1223      OT LONG TERM GOAL #1   Title  Pt will demonstrate understanding of lymphedema (LE) precautions / prevention principals, including signs / symptoms of cellulitis infection with modified independence using LE Workbook as printed reference to identify 6 precautions without verbal cues by end of 3rd  OT Rx visit.     Baseline  Max A    Time  3    Period  Days    Status  New    Target Date  --   3rd OT visit     OT LONG TERM GOAL #2   Title  Pt will be able to apply multi-layer short stretch compression wraps using correct gradient techniques with independently , but with extra time  to achieve optimal limb volume reduction during Intensive Phase CDT, and to return affected limb(s) , as closely as possible, to premorbid size and shape.     Baseline  dependent    Time  2    Period  Weeks    Status  New    Target Date  --   10th OT visit     OT LONG TERM GOAL #3   Title  Pt to achieve no less than 10% limb volume reduction in affected limb(s) during Intensive Phase CDT to control limb swelling, to improve tissue integrity and immune function, to improve ADLs performance and to improve functional mobility/ transfers.    Baseline  Dependent    Time  12    Period  Weeks    Status  New    Target Date  01/21/19      OT LONG TERM GOAL #4   Baseline  Pt will achieve 100% compliance with daily LE self-care home program components, including proper skin care, simple self-MLD, gradient compression wraps/ garments, and therapeutic exercise to ensure optimal Intensive Phase limb volume reduction to expedite compression garment/ device fitting.    Period  Weeks    Status  New    Target Date  01/21/19      OT LONG TERM GOAL #5   Title  Pt will demonstrate competent use of assistive devices during LE self-care training to improve ability to don and doff compression garments/devices with modified assistance to ensure optimal LE self-management over time.    Baseline  Max A    Time  12    Period  Weeks    Status  New    Target Date  01/21/19      Long Term Additional Goals   Additional Long Term Goals  Yes      OT LONG TERM GOAL #6   Title  Pt will retain optimal limb volume reductions achieved during Intensive Phase CDT with no more than 3% volume increase  without CG assistance to limit LE progression and further functional decline.    Baseline  Max A    Time  6    Period  Months    Status  New    Target Date  04/21/19            Plan - 11/06/18 1528    Clinical Impression Statement  Housemate did a nice job with compression wrap application. LLE iis reduced in valume. Skin flexibility is minimally changed. Provided MLD and reapplied compression wrap as established. Provided skin care with low pH lotion to increase  hydration and  skin mobility. Cont as per POC.    Occupational Profile and client history currently impacting functional performance  chronic pain    Occupational performance deficits (Please refer to evaluation for details):  ADL's;IADL's;Leisure;Rest and Sleep;Social Participation;Work    Rehab Potential  Good    Current Impairments/barriers affecting progress:  chronic pain    OT Frequency  3x / week    OT Duration  12 weeks    OT Treatment/Interventions  Self-care/ADL training;Therapeutic exercise;DME and/or AE instruction;Building services engineer;Compression bandaging;Coping strategies training;Manual Therapy;Manual lymph drainage;Therapeutic activities    Plan  Fit with appropriate daytime compression garments and HOS devices.     Clinical Decision Making  Several treatment options, min-mod task modification necessary    Recommended Other Services  Dermatology to treat stasis dermatitis    Consulted and Agree with Plan of Care  Patient       Patient will benefit from skilled therapeutic intervention in order to improve the following deficits and impairments:  Decreased balance, Decreased mobility, Decreased skin integrity, Difficulty walking, Decreased coping skills, Decreased knowledge of precautions, Decreased range of motion, Increased edema, Impaired perceived functional ability, Pain, Decreased activity tolerance, Decreased knowledge of use of DME, Decreased strength, Impaired flexibility  Visit Diagnosis: Lymphedema, not elsewhere classified    Problem List Patient Active Problem List   Diagnosis Date Noted  . 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 [place], [place], [address] Phone: [phone]   Fax:  [phone]  Name: Wendy Henderson MRN: [id number] Date of Birth: 1971/05/16

## 2018-11-07 ENCOUNTER — Ambulatory Visit: Payer: Medicare Other | Admitting: Occupational Therapy

## 2018-11-07 DIAGNOSIS — I890 Lymphedema, not elsewhere classified: Secondary | ICD-10-CM

## 2018-11-07 NOTE — Therapy (Signed)
[facility] MAIN [facility] SERVICES [address] [place], [place], [address] Phone: [phone]   Fax:  [phone]  Occupational Therapy Treatment  Patient Details  Name: Wendy Henderson MRN: [id number] Date of Birth: Jul 12, 1971 Referring Provider (OT): Zara Council, MD [phone])   Encounter Date: 11/07/2018  OT End of Session - 11/07/18 1256    Visit Number  5    Number of Visits  36    Date for OT Re-Evaluation  01/21/19    OT Start Time  1000    OT Stop Time  1105    OT Time Calculation (min)  65 min    Activity Tolerance  Patient tolerated treatment well;No increased pain    Behavior During Therapy  WFL for tasks assessed/performed       Past Medical History:  Diagnosis Date  . Anxiety   . Arthritis   . Complication of anesthesia    slow to wake up - once   . Hypertension   . Varicose veins     Past Surgical History:  Procedure Laterality Date  . ABDOMINAL HYSTERECTOMY     partial   . BACK SURGERY     L5-S1   . JOINT REPLACEMENT     left   . left hip surgery      rod left hip to knee   . right knee partial replacemnt     . TOTAL KNEE REVISION Right 06/09/2014   Procedure: RIGHT TOTAL KNEE REVISION ;  Surgeon: Shelda Pal, MD;  Location: [facility];  Service: Orthopedics;  Laterality: Right;    There were no vitals filed for this visit.  Subjective Assessment - 11/07/18 1003    Subjective   Wendy Henderson presents for OT visit 5/36 for CDT to BLE.Pt is un accompanied by her son today.. She rpresents with compression wraps in place.She reports very good tolerance for compression wraps between sessions.    Pertinent History  hx bleeding of varicose veins, HTN, hx anxiety, OA, avascular necrosis of L hip, L hip replacement, s/p spinal fusion, S/P R knee partial replacement, L hip sx rod to knee, R total knee revision,  chronic pain, hx recurrent cellulitis by report (not noted in EMR), current everyday smoker    Limitations   difficulty walking, chronic pain, chronic BLE swelling,     Repetition  Increases Symptoms    Currently in Pain?  Yes    Pain Score  --   not rated. Pt reports compression wraps and MLD provide analgesis effect during and after visit   Pain Location  Leg    Pain Descriptors / Indicators  Heaviness;Tender;Pins and needles;Discomfort;Sore;Tightness;Pressure;Numbness;Guarding;Tiring    Pain Type  Chronic pain    Pain Onset  Other (comment)   BLE swelling and associated pain reported 2016 s/p R knee sx   Pain Frequency  Intermittent                   OT Treatments/Exercises (OP) - 11/07/18 0001      ADLs   ADL Education Given  Yes      Manual Therapy   Manual Therapy  Edema management;Manual Lymphatic Drainage (MLD);Compression Bandaging    Manual Lymphatic Drainage (MLD)  MLD using short neck, deep abdominal and functional inguinal pathways.     Compression Bandaging  Knee length, gradient compression wrap applied to ALLE. Utilized 0.4 cm thiclk Rosidal foam layer ocver cotton stockinett applied circumferentially under 2 layers of short stretch wraps, 1  8 cm and 1 10 cm,. Stretch net over all.             OT Education - 11/07/18 1255    Education Details  Continued skilled Pt/caregiver education  And LE ADL training throughout visit for lymphedema self care/ home program, including compression wrapping, compression garment and device wear/care, lymphatic pumping ther ex, simple self-MLD, and skin care. Discussed progress towards goals.     Person(s) Educated  Patient;Child(ren)    Methods  Explanation;Demonstration;Tactile cues;Verbal cues    Comprehension  Verbalized understanding;Need further instruction;Returned demonstration;Verbal cues required;Tactile cues required          OT Long Term Goals - 10/25/18 1223      OT LONG TERM GOAL #1   Title  Pt will demonstrate understanding of lymphedema (LE) precautions / prevention principals, including signs /  symptoms of cellulitis infection with modified independence using LE Workbook as printed reference to identify 6 precautions without verbal cues by end of 3rd  OT Rx visit.     Baseline  Max A    Time  3    Period  Days    Status  New    Target Date  --   3rd OT visit     OT LONG TERM GOAL #2   Title  Pt will be able to apply multi-layer short stretch compression wraps using correct gradient techniques with independently , but with extra time  to achieve optimal limb volume reduction during Intensive Phase CDT, and to return affected limb(s) , as closely as possible, to premorbid size and shape.    Baseline  dependent    Time  2    Period  Weeks    Status  New    Target Date  --   10th OT visit     OT LONG TERM GOAL #3   Title  Pt to achieve no less than 10% limb volume reduction in affected limb(s) during Intensive Phase CDT to control limb swelling, to improve tissue integrity and immune function, to improve ADLs performance and to improve functional mobility/ transfers.    Baseline  Dependent    Time  12    Period  Weeks    Status  New    Target Date  01/21/19      OT LONG TERM GOAL #4   Baseline  Pt will achieve 100% compliance with daily LE self-care home program components, including proper skin care, simple self-MLD, gradient compression wraps/ garments, and therapeutic exercise to ensure optimal Intensive Phase limb volume reduction to expedite compression garment/ device fitting.    Period  Weeks    Status  New    Target Date  01/21/19      OT LONG TERM GOAL #5   Title  Pt will demonstrate competent use of assistive devices during LE self-care training to improve ability to don and doff compression garments/devices with modified assistance to ensure optimal LE self-management over time.    Baseline  Max A    Time  12    Period  Weeks    Status  New    Target Date  01/21/19      Long Term Additional Goals   Additional Long Term Goals  Yes      OT LONG TERM GOAL #6    Title  Pt will retain optimal limb volume reductions achieved during Intensive Phase CDT with no more than 3% volume increase  without CG assistance to limit LE progression and  further functional decline.    Baseline  Max A    Time  6    Period  Months    Status  New    Target Date  04/21/19            Plan - 11/07/18 1257    Clinical Impression Statement  Pt tolerating compression wraps between sessions so far without difficulty. issue hydration is mildly improved since commencing CDT, and skin mobility is beginning to increase, but only slightly so far. Pt is pleased with response to CDT thus far. Provided MLD, skin care and compression  therapy in clinic today. Cont as per POC.    Occupational Profile and client history currently impacting functional performance  chronic pain    Occupational performance deficits (Please refer to evaluation for details):  ADL's;IADL's;Leisure;Rest and Sleep;Social Participation;Work    Rehab Potential  Good    Current Impairments/barriers affecting progress:  chronic pain    OT Frequency  3x / week    OT Duration  12 weeks    OT Treatment/Interventions  Self-care/ADL training;Therapeutic exercise;DME and/or AE instruction;Building services engineer;Compression bandaging;Coping strategies training;Manual Therapy;Manual lymph drainage;Therapeutic activities    Plan  Fit with appropriate daytime compression garments and HOS devices.     Clinical Decision Making  Several treatment options, min-mod task modification necessary    Recommended Other Services  Dermatology to treat stasis dermatitis    Consulted and Agree with Plan of Care  Patient       Patient will benefit from skilled therapeutic intervention in order to improve the following deficits and impairments:  Decreased balance, Decreased mobility, Decreased skin integrity, Difficulty walking, Decreased coping skills, Decreased knowledge of precautions, Decreased range of motion, Increased edema,  Impaired perceived functional ability, Pain, Decreased activity tolerance, Decreased knowledge of use of DME, Decreased strength, Impaired flexibility  Visit Diagnosis: Lymphedema, not elsewhere classified    Problem List Patient Active Problem List   Diagnosis Date Noted  . 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

## 2018-11-12 ENCOUNTER — Ambulatory Visit: Payer: Medicare Other | Attending: Internal Medicine | Admitting: Occupational Therapy

## 2018-11-12 DIAGNOSIS — I890 Lymphedema, not elsewhere classified: Secondary | ICD-10-CM | POA: Insufficient documentation

## 2018-11-14 ENCOUNTER — Ambulatory Visit: Payer: Medicare Other | Admitting: Occupational Therapy

## 2018-11-15 ENCOUNTER — Ambulatory Visit: Payer: Medicare Other | Admitting: Occupational Therapy

## 2018-11-19 ENCOUNTER — Ambulatory Visit: Payer: Medicare Other | Admitting: Occupational Therapy

## 2018-11-20 ENCOUNTER — Ambulatory Visit: Payer: Medicare Other | Admitting: Occupational Therapy

## 2018-11-22 ENCOUNTER — Encounter: Payer: Medicare Other | Admitting: Occupational Therapy

## 2018-11-26 ENCOUNTER — Ambulatory Visit: Payer: Medicare Other | Admitting: Occupational Therapy

## 2018-11-27 ENCOUNTER — Ambulatory Visit: Payer: Medicare Other | Admitting: Occupational Therapy

## 2018-11-27 DIAGNOSIS — I890 Lymphedema, not elsewhere classified: Secondary | ICD-10-CM

## 2018-11-28 NOTE — Therapy (Signed)
[facility] [address] [place], [place], [address] Phone: [phone]   Fax:  [phone]  Occupational Therapy Treatment  Patient Details  Name: Wendy Henderson MRN: [id number] Date of Birth: 11/26/1971 Referring Provider (OT): Zara CouncilAdwait Silwal, MD [phone]([id number])   Encounter Date: 11/27/2018  OT End of Session - 11/27/18 1313    Visit Number  6    Number of Visits  36    Date for OT Re-Evaluation  01/21/19    OT Start Time  1130    OT Stop Time  1226    OT Time Calculation (min)  56 min    Activity Tolerance  Patient tolerated treatment well;No increased pain    Behavior During Therapy  WFL for tasks assessed/performed       Past Medical History:  Diagnosis Date  . Anxiety   . Arthritis   . Complication of anesthesia    slow to wake up - once   . Hypertension   . Varicose veins     Past Surgical History:  Procedure Laterality Date  . ABDOMINAL HYSTERECTOMY     partial   . BACK SURGERY     L5-S1   . JOINT REPLACEMENT     left   . left hip surgery      rod left hip to knee   . right knee partial replacemnt     . TOTAL KNEE REVISION Right 06/09/2014   Procedure: RIGHT TOTAL KNEE REVISION ;  Surgeon: Shelda PalMatthew D Olin, MD;  Location: [facility];  Service: Orthopedics;  Laterality: Right;    There were no vitals filed for this visit.  Subjective Assessment - 11/28/18 1307    Subjective   [name] presents for OT visit 5/36 for CDT to BLE.Pt is un accompanied by her son today.. She rpresents with compression wraps in place.She reports very good tolerance for compression wraps between sessions.    Pertinent History  hx bleeding of varicose veins, HTN, hx anxiety, OA, avascular necrosis of L hip, L hip replacement, s/p spinal fusion, S/P R knee partial replacement, L hip sx rod to knee, R total knee revision,  chronic pain, hx recurrent cellulitis by report (not noted in EMR), current everyday smoker    Limitations   difficulty walking, chronic pain, chronic BLE swelling,     Repetition  Increases Symptoms    Pain Onset  Other (comment)   BLE swelling and associated pain reported 2016 s/p R knee sx                  OT Treatments/Exercises (OP) - 11/28/18 0001      ADLs   ADL Education Given  Yes      Manual Therapy   Manual Therapy  Edema management;Manual Lymphatic Drainage (MLD);Compression Bandaging    Manual Lymphatic Drainage (MLD)  MLD using short neck, deep abdominal and functional inguinal pathways.     Compression Bandaging  Knee length, gradient compression wrap applied to ALLE. Utilized 0.4 cm thiclk Rosidal foam layer ocver cotton stockinett applied circumferentially under 2 layers of short stretch wraps, 1 8 cm and 1 10 cm,. Stretch net over all.             OT Education - 11/27/18 1310    Education Details  Continued skilled Pt/caregiver education  And LE ADL training throughout visit for lymphedema self care/ home program, including compression wrapping, compression garment and device wear/care, lymphatic pumping ther ex, simple self-MLD,  and skin care. Discussed progress towards goals.     Person(s) Educated  Patient;Child(ren)    Methods  Explanation;Demonstration;Tactile cues;Verbal cues    Comprehension  Verbalized understanding;Need further instruction;Returned demonstration;Verbal cues required;Tactile cues required          OT Long Term Goals - 10/25/18 1223      OT LONG TERM GOAL #1   Title  Pt will demonstrate understanding of lymphedema (LE) precautions / prevention principals, including signs / symptoms of cellulitis infection with modified independence using LE Workbook as printed reference to identify 6 precautions without verbal cues by end of 3rd  OT Rx visit.     Baseline  Max A    Time  3    Period  Days    Status  New    Target Date  --   3rd OT visit     OT LONG TERM GOAL #2   Title  Pt will be able to apply multi-layer short stretch  compression wraps using correct gradient techniques with independently , but with extra time  to achieve optimal limb volume reduction during Intensive Phase CDT, and to return affected limb(s) , as closely as possible, to premorbid size and shape.    Baseline  dependent    Time  2    Period  Weeks    Status  New    Target Date  --   10th OT visit     OT LONG TERM GOAL #3   Title  Pt to achieve no less than 10% limb volume reduction in affected limb(s) during Intensive Phase CDT to control limb swelling, to improve tissue integrity and immune function, to improve ADLs performance and to improve functional mobility/ transfers.    Baseline  Dependent    Time  12    Period  Weeks    Status  New    Target Date  01/21/19      OT LONG TERM GOAL #4   Baseline  Pt will achieve 100% compliance with daily LE self-care home program components, including proper skin care, simple self-MLD, gradient compression wraps/ garments, and therapeutic exercise to ensure optimal Intensive Phase limb volume reduction to expedite compression garment/ device fitting.    Period  Weeks    Status  New    Target Date  01/21/19      OT LONG TERM GOAL #5   Title  Pt will demonstrate competent use of assistive devices during LE self-care training to improve ability to don and doff compression garments/devices with modified assistance to ensure optimal LE self-management over time.    Baseline  Max A    Time  12    Period  Weeks    Status  New    Target Date  01/21/19      Long Term Additional Goals   Additional Long Term Goals  Yes      OT LONG TERM GOAL #6   Title  Pt will retain optimal limb volume reductions achieved during Intensive Phase CDT with no more than 3% volume increase  without CG assistance to limit LE progression and further functional decline.    Baseline  Max A    Time  6    Period  Months    Status  New    Target Date  04/21/19            Plan - 11/27/18 1311    Clinical Impression  Statement  Resumed CDT today. Provided MLD, skin care  and compression wraps to LLE. Pt tolerate all aspects and engaged fully in therapy. Clinical gains made in limb volume reduction and skin mobility were not retained , thus we're restartting all goals today.    Occupational Profile and client history currently impacting functional performance  chronic pain    Occupational performance deficits (Please refer to evaluation for details):  ADL's;IADL's;Leisure;Rest and Sleep;Social Participation;Work    Rehab Potential  Good    Current Impairments/barriers affecting progress:  chronic pain    OT Frequency  3x / week    OT Duration  12 weeks    OT Treatment/Interventions  Self-care/ADL training;Therapeutic exercise;DME and/or AE instruction;Building services engineer;Compression bandaging;Coping strategies training;Manual Therapy;Manual lymph drainage;Therapeutic activities    Plan  Fit with appropriate daytime compression garments and HOS devices.     Clinical Decision Making  Several treatment options, min-mod task modification necessary    Recommended Other Services  Dermatology to treat stasis dermatitis    Consulted and Agree with Plan of Care  Patient       Patient will benefit from skilled therapeutic intervention in order to improve the following deficits and impairments:  Decreased balance, Decreased mobility, Decreased skin integrity, Difficulty walking, Decreased coping skills, Decreased knowledge of precautions, Decreased range of motion, Increased edema, Impaired perceived functional ability, Pain, Decreased activity tolerance, Decreased knowledge of use of DME, Decreased strength, Impaired flexibility  Visit Diagnosis: Lymphedema, not elsewhere classified    Problem List Patient Active Problem List   Diagnosis Date Noted  . 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 [place], [place], [address] Phone: [phone]   Fax:  [phone]  Name: Wendy Henderson MRN: [id number] Date of Birth: 1971/01/18

## 2018-11-29 ENCOUNTER — Ambulatory Visit: Payer: Medicare Other | Admitting: Occupational Therapy

## 2018-12-03 ENCOUNTER — Ambulatory Visit: Payer: Medicare Other | Admitting: Occupational Therapy

## 2018-12-17 ENCOUNTER — Ambulatory Visit: Payer: Medicare Other | Admitting: Occupational Therapy

## 2018-12-18 ENCOUNTER — Ambulatory Visit: Payer: Medicare Other | Attending: Internal Medicine | Admitting: Occupational Therapy

## 2018-12-20 ENCOUNTER — Ambulatory Visit: Payer: Medicare Other | Admitting: Occupational Therapy

## 2018-12-24 ENCOUNTER — Ambulatory Visit: Payer: Medicare Other | Admitting: Occupational Therapy

## 2018-12-25 ENCOUNTER — Ambulatory Visit: Payer: Medicare Other | Admitting: Occupational Therapy

## 2018-12-27 ENCOUNTER — Encounter: Payer: Medicare Other | Admitting: Occupational Therapy

## 2018-12-31 ENCOUNTER — Encounter: Payer: Medicare Other | Admitting: Occupational Therapy

## 2019-01-01 ENCOUNTER — Encounter: Payer: Medicare Other | Admitting: Occupational Therapy

## 2019-01-03 ENCOUNTER — Encounter: Payer: Medicare Other | Admitting: Occupational Therapy

## 2019-01-07 ENCOUNTER — Encounter: Payer: Medicare Other | Admitting: Occupational Therapy

## 2019-01-08 ENCOUNTER — Encounter: Payer: Medicare Other | Admitting: Occupational Therapy

## 2019-01-10 ENCOUNTER — Encounter: Payer: Medicare Other | Admitting: Occupational Therapy

## 2019-01-14 ENCOUNTER — Encounter: Payer: Self-pay | Admitting: Occupational Therapy

## 2019-01-15 ENCOUNTER — Encounter: Payer: Self-pay | Admitting: Occupational Therapy

## 2019-01-17 ENCOUNTER — Encounter: Payer: Self-pay | Admitting: Occupational Therapy

## 2019-01-21 ENCOUNTER — Encounter: Payer: Self-pay | Admitting: Occupational Therapy

## 2019-01-22 ENCOUNTER — Encounter: Payer: Self-pay | Admitting: Occupational Therapy

## 2019-01-24 ENCOUNTER — Encounter: Payer: Self-pay | Admitting: Occupational Therapy

## 2021-02-22 ENCOUNTER — Other Ambulatory Visit: Payer: Self-pay | Admitting: Neurology

## 2021-02-22 DIAGNOSIS — G935 Compression of brain: Secondary | ICD-10-CM

## 2021-03-08 ENCOUNTER — Other Ambulatory Visit: Payer: Self-pay

## 2021-03-08 ENCOUNTER — Ambulatory Visit
Admission: RE | Admit: 2021-03-08 | Discharge: 2021-03-08 | Disposition: A | Discharge status: Home / Self Care | Payer: Medicare Other | Source: Ambulatory Visit | Attending: Neurology | Admitting: Neurology

## 2021-03-08 DIAGNOSIS — G935 Compression of brain: Secondary | ICD-10-CM | POA: Insufficient documentation

## 2021-05-24 ENCOUNTER — Ambulatory Visit: Payer: Medicare Other | Admitting: Adult Health

## 2021-05-24 DIAGNOSIS — Z0289 Encounter for other administrative examinations: Secondary | ICD-10-CM

## 2021-11-03 ENCOUNTER — Telehealth: Payer: Self-pay

## 2021-11-03 NOTE — Telephone Encounter (Signed)
CALLED PATIENT NO ANSWER LEFT VOICEMAIL FOR A CALL BACK °Letter sent °
